# Patient Record
Sex: Male | Born: 1975
Health system: Southern US, Community
[De-identification: ages and names within clinical notes are randomized; demographics above are authoritative.]

## PROBLEM LIST (undated history)

## (undated) DIAGNOSIS — I10 Essential (primary) hypertension: Secondary | ICD-10-CM

## (undated) DIAGNOSIS — E785 Hyperlipidemia, unspecified: Secondary | ICD-10-CM

## (undated) HISTORY — PX: VASECTOMY: SHX75

## (undated) HISTORY — PX: INGUINAL HERNIA REPAIR: SUR1180

## (undated) HISTORY — PX: INGUINAL HERNIA REPAIR: SHX194

---

## 2010-11-29 ENCOUNTER — Emergency Department (HOSPITAL_COMMUNITY): Payer: PRIVATE HEALTH INSURANCE

## 2010-11-29 ENCOUNTER — Encounter: Payer: Self-pay | Admitting: Emergency Medicine

## 2010-11-29 ENCOUNTER — Other Ambulatory Visit: Payer: Self-pay

## 2010-11-29 ENCOUNTER — Emergency Department (HOSPITAL_COMMUNITY)
Admission: EM | Admit: 2010-11-29 | Discharge: 2010-11-29 | Disposition: A | Discharge status: Home / Self Care | Payer: PRIVATE HEALTH INSURANCE | Attending: Emergency Medicine | Admitting: Emergency Medicine

## 2010-11-29 DIAGNOSIS — E785 Hyperlipidemia, unspecified: Secondary | ICD-10-CM | POA: Insufficient documentation

## 2010-11-29 DIAGNOSIS — I10 Essential (primary) hypertension: Secondary | ICD-10-CM | POA: Insufficient documentation

## 2010-11-29 DIAGNOSIS — S0990XA Unspecified injury of head, initial encounter: Secondary | ICD-10-CM | POA: Insufficient documentation

## 2010-11-29 DIAGNOSIS — R296 Repeated falls: Secondary | ICD-10-CM | POA: Insufficient documentation

## 2010-11-29 DIAGNOSIS — R232 Flushing: Secondary | ICD-10-CM | POA: Insufficient documentation

## 2010-11-29 DIAGNOSIS — S0003XA Contusion of scalp, initial encounter: Secondary | ICD-10-CM | POA: Insufficient documentation

## 2010-11-29 DIAGNOSIS — R42 Dizziness and giddiness: Secondary | ICD-10-CM | POA: Insufficient documentation

## 2010-11-29 DIAGNOSIS — R55 Syncope and collapse: Secondary | ICD-10-CM | POA: Insufficient documentation

## 2010-11-29 DIAGNOSIS — R404 Transient alteration of awareness: Secondary | ICD-10-CM | POA: Insufficient documentation

## 2010-11-29 DIAGNOSIS — S1093XA Contusion of unspecified part of neck, initial encounter: Secondary | ICD-10-CM | POA: Insufficient documentation

## 2010-11-29 DIAGNOSIS — R51 Headache: Secondary | ICD-10-CM | POA: Insufficient documentation

## 2010-11-29 HISTORY — DX: Hyperlipidemia, unspecified: E78.5

## 2010-11-29 HISTORY — DX: Essential (primary) hypertension: I10

## 2010-11-29 LAB — DIFFERENTIAL
Eosinophils Absolute: 0.1 10*3/uL (ref 0.0–0.7)
Eosinophils Relative: 1 % (ref 0–5)
Lymphocytes Relative: 23 % (ref 12–46)
Lymphs Abs: 1.6 10*3/uL (ref 0.7–4.0)
Monocytes Absolute: 0.6 10*3/uL (ref 0.1–1.0)

## 2010-11-29 LAB — URINALYSIS, ROUTINE W REFLEX MICROSCOPIC
Bilirubin Urine: NEGATIVE
Leukocytes, UA: NEGATIVE
Nitrite: NEGATIVE
Specific Gravity, Urine: 1.023 (ref 1.005–1.030)
Urobilinogen, UA: 0.2 mg/dL (ref 0.0–1.0)
pH: 5 (ref 5.0–8.0)

## 2010-11-29 LAB — CBC
HCT: 43.8 % (ref 39.0–52.0)
MCH: 28.5 pg (ref 26.0–34.0)
MCV: 85.4 fL (ref 78.0–100.0)
Platelets: 166 10*3/uL (ref 150–400)
RBC: 5.13 MIL/uL (ref 4.22–5.81)
WBC: 7.1 10*3/uL (ref 4.0–10.5)

## 2010-11-29 LAB — COMPREHENSIVE METABOLIC PANEL
BUN: 17 mg/dL (ref 6–23)
CO2: 24 mEq/L (ref 19–32)
Calcium: 9.2 mg/dL (ref 8.4–10.5)
Creatinine, Ser: 0.97 mg/dL (ref 0.50–1.35)
GFR calc Af Amer: >90 mL/min (ref >90)
GFR calc non Af Amer: >90 mL/min (ref >90)
Glucose, Bld: 107 mg/dL — ABNORMAL HIGH (ref 70–99)
Sodium: 142 mEq/L (ref 135–145)
Total Protein: 6.6 g/dL (ref 6.0–8.3)

## 2010-11-29 MED ORDER — ACETAMINOPHEN 325 MG PO TABS
650.0000 mg | ORAL_TABLET | Freq: Four times a day (QID) | ORAL | Status: DC | PRN
  Administered 2010-11-29: 650 mg via ORAL

## 2010-11-29 NOTE — ED Notes (Signed)
Family at bedside. No distress noted. Pt provided with another ice pack.

## 2010-11-29 NOTE — ED Notes (Signed)
Pt fell from standing. Pt has small lac on face and hematoma on posterior scalp. Pt moving all extremities and handling own secretions. No distress noted. No neuro deficits noted.

## 2010-11-29 NOTE — ED Provider Notes (Signed)
History     CSN: [id number] Arrival date & time: 11/29/2010  3:39 AM   First MD Initiated Contact with Patient 11/29/10 0340      Chief Complaint  Patient presents with  . Loss of Consciousness    approx 1 minute. pt fell from standing    (Consider location/radiation/quality/duration/timing/severity/associated sxs/prior treatment) HPI Comments: While urinating in bathroom - had LOC and struck head on the floor - felt flushed, nauseated and hot prior to event, but no visual change, numbness, weakness, incontinence, blood in stool, v/c/n/v or other c/o tonight - currently back to self but c/o ongoing headache and hematoma to the back L and front R.  Patient is a 35 y.o. male presenting with syncope.  Loss of Consciousness This is a new problem. The current episode started less than 1 hour ago. The problem occurs rarely. The problem has been resolved. Associated symptoms include headaches ( after hitting head with fall in bath[place]). Pertinent negatives include no chest pain, no abdominal pain and no shortness of breath. Exacerbated by: taking niacin - has been making him feel flushed and dizzy nightly. The symptoms are relieved by rest. He has tried nothing for the symptoms.    Past Medical History  Diagnosis Date  . Hyperlipemia   . Hypertension     History reviewed. No pertinent past surgical history.  History reviewed. No pertinent family history.  History  Substance Use Topics  . Smoking status: Not on file  . Smokeless tobacco: Not on file  . Alcohol Use: Yes      Review of Systems  Respiratory: Negative for shortness of breath.   Cardiovascular: Positive for syncope. Negative for chest pain.  Gastrointestinal: Negative for abdominal pain.  Neurological: Positive for headaches ( after hitting head with fall in [place]).  All other systems reviewed and are negative.    Allergies  Review of patient's allergies indicates no known allergies.  Home Medications  No  current outpatient prescriptions on file.  BP 132/84  Pulse 87  Temp(Src) 97.3 F (36.3 C) (Oral)  Resp 15  Ht 5\' 9"  (1.753 m)  Wt 190 lb (86.183 kg)  BMI 28.06 kg/m2  SpO2 98%  Physical Exam  Nursing note and vitals reviewed. Constitutional: He appears well-developed and well-nourished. No distress.  HENT:  Head: Normocephalic.  Mouth/Throat: Oropharynx is clear and moist. No oropharyngeal exudate.       No malocclusion, hematoma to the R forehead and L occiput.  No lac  Eyes: Conjunctivae and EOM are normal. Pupils are equal, round, and reactive to light. Right eye exhibits no discharge. Left eye exhibits no discharge. No scleral icterus.  Neck: Normal range of motion. Neck supple. No JVD present. No thyromegaly present.  Cardiovascular: Normal rate, regular rhythm, normal heart sounds and intact distal pulses.  Exam reveals no gallop and no friction rub.   No murmur heard. Pulmonary/Chest: Effort normal and breath sounds normal. No respiratory distress. He has no wheezes. He has no rales.  Abdominal: Soft. Bowel sounds are normal. He exhibits no distension and no mass. There is no tenderness.  Musculoskeletal: Normal range of motion. He exhibits no edema and no tenderness ( no ttp to the spine ).  Lymphadenopathy:    He has no cervical adenopathy.  Neurological: He is alert. Coordination normal.        Speechg, motor, sensory and visual normal.  CN 3-12 intact.  Skin: Skin is warm and dry. No rash noted. No erythema.  Psychiatric: He  has a normal mood and affect. His behavior is normal.    ED Course  Procedures (including critical care time)  Labs Reviewed  COMPREHENSIVE METABOLIC PANEL - Abnormal; Notable for the following:    Glucose, Bld 107 (*)    All other components within normal limits  CBC  DIFFERENTIAL  URINALYSIS, ROUTINE W REFLEX MICROSCOPIC   Ct Head Wo Contrast  11/29/2010  *RADIOLOGY REPORT*  Clinical Data: Syncope; status post fall.  Hit head, with  abrasion above the right eye and swelling at the back of the head.  CT HEAD WITHOUT CONTRAST  Technique:  Contiguous axial images were obtained from the base of the skull through the vertex without contrast.  Comparison: None.  Findings: There is no evidence of acute infarction, mass lesion, or intra- or extra-axial hemorrhage on CT.  The posterior fossa, including the cerebellum, brainstem and fourth ventricle, is within normal limits.  The third and lateral ventricles, and basal ganglia are unremarkable in appearance.  The cerebral hemispheres are symmetric in appearance, with normal gray- white differentiation.  No mass effect or midline shift is seen.  There is no evidence of fracture; visualized osseous structures are unremarkable in appearance.  The visualized portions of the orbits are within normal limits.  The paranasal sinuses and mastoid air cells are well-aerated.  A scalp hematoma is noted overlying the high left parietal calvarium.  IMPRESSION:  1.  No evidence of traumatic intracranial injury or fracture. 2.  Scalp hematoma overlying the high left parietal calvarium.  Original Report Authenticated By: Tonia Ghent, M.D.     1. Syncope   2. Head injury       MDM  Likely related to niacin, ECG normal, labs and CT ordered.  ED ECG REPORT   Date: 11/29/2010   Rate: 76  Rhythm: normal sinus rhythm  QRS Axis: normal  Intervals: normal  ST/T Wave abnormalities: normal  Conduction Disutrbances:none  Narrative Interpretation:   Old EKG Reviewed: none available   Labs all neg, d/w pt - stop niacin - understanding expressed.      Vida Roller, MD 11/29/10 [phone]

## 2010-12-01 ENCOUNTER — Other Ambulatory Visit: Payer: Self-pay | Admitting: Family Medicine

## 2010-12-02 ENCOUNTER — Ambulatory Visit
Admission: RE | Admit: 2010-12-02 | Discharge: 2010-12-02 | Disposition: A | Discharge status: Home / Self Care | Payer: PRIVATE HEALTH INSURANCE | Source: Ambulatory Visit | Attending: Family Medicine | Admitting: Family Medicine

## 2014-01-28 ENCOUNTER — Telehealth: Payer: Self-pay | Admitting: Cardiology

## 2014-01-28 NOTE — Telephone Encounter (Signed)
Received records from [facility] [place] for appointment with Dr Antoine PocheHochrein on 01/31/14.  [facility] for Dr Hochrein's schedule on 01/31/14.  lp

## 2014-01-31 ENCOUNTER — Ambulatory Visit (INDEPENDENT_AMBULATORY_CARE_PROVIDER_SITE_OTHER): Payer: BC Managed Care – PPO | Admitting: Cardiology

## 2014-01-31 ENCOUNTER — Encounter: Payer: Self-pay | Admitting: Cardiology

## 2014-01-31 VITALS — BP 120/80 | Ht 68.0 in | Wt 220.0 lb

## 2014-01-31 DIAGNOSIS — R0789 Other chest pain: Secondary | ICD-10-CM | POA: Diagnosis not present

## 2014-01-31 DIAGNOSIS — R5383 Other fatigue: Secondary | ICD-10-CM

## 2014-01-31 NOTE — Progress Notes (Signed)
HPI The patient presents for evaluation of dyspnea. He has had a vague history of mitral valve prolapse although I reviewed an echo that was done in [place] last year and there was no mention of prolapse or regurgitation. He's had evaluation for dyspnea and I do see a negative exercise treadmill test in 2009. He actually had been doing well for a number of years but stopped exercising and started gaining weight more recently. He's had increased dyspnea. He notices this with activity such as climbing one or 2 flights of stairs. He does not have chest pressure, neck or arm discomfort. He does not have PND or orthopnea. He does have sleep apnea and feels better since he's been using CPAP. He has some mild lower extremity swelling.  He came in because of his family history and concerns about his risk factors and overall general decline in health.  Allergies  Allergen Reactions  . Niacin And Related Other (See Comments)    Passing out    Current Outpatient Prescriptions  Medication Sig Dispense Refill  . atorvastatin (LIPITOR) 10 MG tablet Take 10 mg by mouth daily.      Marland Kitchen. losartan (COZAAR) 50 MG tablet Take 50 mg by mouth daily.     No current facility-administered medications for this visit.    Past Medical History  Diagnosis Date  . Hyperlipemia   . Hypertension     No past surgical history on file.  No family history on file.  History   Social History  . Marital Status: Married    Spouse Name: N/A    Number of Children: N/A  . Years of Education: N/A   Occupational History  . Not on file.   Social History Main Topics  . Smoking status: Never Smoker   . Smokeless tobacco: Not on file  . Alcohol Use: 0.6 oz/week    1 Not specified per week  . Drug Use: Not on file  . Sexual Activity: Not on file   Other Topics Concern  . Not on file   Social History Narrative    ROS:    Positive for occasional leg cramps, headaches, dizziness, fatigue, decreased libido,  occasional constipation, reflux. Otherwise as stated in the history of present illness and negative for all other systems.  PHYSICAL EXAM BP 120/80 mmHg  Ht 5\' 8"  (1.727 m)  Wt 220 lb (99.791 kg)  BMI 33.46 kg/m2  GENERAL:  Well appearing HEENT:  Pupils equal round and reactive, fundi not visualized, oral mucosa unremarkable NECK:  No jugular venous distention, waveform within normal limits, carotid upstroke brisk and symmetric, no bruits, no thyromegaly LYMPHATICS:  No cervical, inguinal adenopathy LUNGS:  Clear to auscultation bilaterally BACK:  No CVA tenderness CHEST:  Unremarkable HEART:  PMI not displaced or sustained,S1 and S2 within normal limits, no S3, no S4, no clicks, no rubs, NO murmurs ABD:  Flat, positive bowel sounds normal in frequency in pitch, no bruits, no rebound, no guarding, no midline pulsatile mass, no hepatomegaly, no splenomegaly EXT:  2 plus pulses throughout, no edema, no cyanosis no clubbing SKIN:  No rashes no nodules NEURO:  Cranial nerves II through XII grossly intact, motor grossly intact throughout PSYCH:  Cognitively intact, oriented to person place and time  EKG:  Sinus rhythm, rate 90, axis within normal limits, intervals within normal limits, no acute ST-T wave changes.  01/31/2014  ASSESSMENT AND PLAN   OBESITY:  The patient understands the need to lose weight with diet and  exercise. We have discussed specific strategies for this.  HTN:  The blood pressure is at target. No change in medications is indicated. We will continue with therapeutic lifestyle changes (TLC).  DYSPNEA:  I suspect this is multifactorial. I don't strongly suspect obstructive coronary disease but he does have risk factors. He will need exercise testing. I will bring the patient back for a POET (Plain Old Exercise Test). This will allow me to screen for obstructive coronary disease, risk stratify and very importantly provide a prescription for exercise.  I would also like to  order coronary calcium score for risk stratification.  DYSLIPIDEMIA:  The patient has this followed by his primary provider. Goals will be set based on the results of the above testing. For now he will continue the meds as listed.  FATIGUE:  The patient has fatigue and other symptoms. I will check a testosterone level.

## 2014-01-31 NOTE — Patient Instructions (Addendum)
Your physician recommends that you schedule a follow-up appointment in: as needed with Dr. Antoine PocheHochrein  We are ordering some labs for you to get done  We are ordering a stress test for you to get done  We have ordered for you to get a CT calcium scoring test done at [address]

## 2014-02-03 ENCOUNTER — Encounter (HOSPITAL_COMMUNITY): Payer: Self-pay | Admitting: *Deleted

## 2014-02-11 ENCOUNTER — Ambulatory Visit (INDEPENDENT_AMBULATORY_CARE_PROVIDER_SITE_OTHER)
Admission: RE | Admit: 2014-02-11 | Discharge: 2014-02-11 | Disposition: A | Discharge status: Home / Self Care | Payer: Self-pay | Source: Ambulatory Visit | Attending: Cardiology | Admitting: Cardiology

## 2014-02-11 ENCOUNTER — Telehealth (HOSPITAL_COMMUNITY): Payer: Self-pay

## 2014-02-11 DIAGNOSIS — R5383 Other fatigue: Secondary | ICD-10-CM

## 2014-02-11 DIAGNOSIS — R0789 Other chest pain: Secondary | ICD-10-CM

## 2014-02-11 NOTE — Telephone Encounter (Signed)
Encounter complete. 

## 2014-02-13 ENCOUNTER — Ambulatory Visit (HOSPITAL_COMMUNITY)
Admission: RE | Admit: 2014-02-13 | Discharge: 2014-02-13 | Disposition: A | Discharge status: Home / Self Care | Payer: BC Managed Care – PPO | Source: Ambulatory Visit | Attending: Cardiovascular Disease | Admitting: Cardiovascular Disease

## 2014-02-13 DIAGNOSIS — R0609 Other forms of dyspnea: Secondary | ICD-10-CM | POA: Insufficient documentation

## 2014-02-13 DIAGNOSIS — R0789 Other chest pain: Secondary | ICD-10-CM

## 2014-02-13 NOTE — Procedures (Signed)
Exercise Treadmill Test  Pre-Exercise Testing Evaluation  NSR  Test  Exercise Tolerance Test Ordering MD: Angelina SheriffJake Hochrein, MD    Unique Test No: 1  Treadmill:  1  Indication for ETT: exertional dyspnea  Contraindication to ETT: No   Stress Modality: exercise - treadmill  Cardiac Imaging Performed: non   Protocol: standard Bruce - maximal  Max BP:  180/91  Max MPHR (bpm):  180 85% MPR (bpm):  153  MPHR obtained (bpm):  169 % MPHR obtained:  93  Reached 85% MPHR (min:sec):  6:20 Total Exercise Time (min-sec):  9  Workload in METS:  10.1 Borg Scale: 15  Reason ETT Terminated:  dyspnea    ST Segment Analysis At Rest: normal ST segments - no evidence of significant ST depression With Exercise: no evidence of significant ST depression  Other Information Arrhythmia:  No Angina during ETT:  absent (0) Quality of ETT:  diagnostic  ETT Interpretation:  normal - no evidence of ischemia by ST analysis  Comments: Good exercise tolerance. Normal BP response to exercise.   Thurmon FairMihai Nox Talent, MD, [facility] [phone] office [phone] pager

## 2014-02-14 LAB — TESTOSTERONE: TESTOSTERONE: 207 ng/dL — AB (ref 300–890)

## 2014-02-20 ENCOUNTER — Telehealth: Payer: Self-pay | Admitting: Cardiology

## 2014-02-20 NOTE — Telephone Encounter (Signed)
Pt is returning JC's call in reference to some blood work and a stress test he had done. Please call back  Thanks

## 2014-02-20 NOTE — Telephone Encounter (Signed)
Received call back from patient.Lab and calcium score results given.Advised will need to schedule appointment with PCP Dr.William Tiburcio PeaHarris.Copy of lab and calcium score sent to Dr.Harris.

## 2014-02-20 NOTE — Telephone Encounter (Signed)
Returned call to patient no answer.LMTC. 

## 2015-02-24 ENCOUNTER — Ambulatory Visit
Admission: RE | Admit: 2015-02-24 | Discharge: 2015-02-24 | Disposition: A | Discharge status: Home / Self Care | Payer: BC Managed Care – PPO | Source: Ambulatory Visit | Attending: Family Medicine | Admitting: Family Medicine

## 2015-02-24 ENCOUNTER — Other Ambulatory Visit: Payer: Self-pay | Admitting: Family Medicine

## 2015-02-24 DIAGNOSIS — R071 Chest pain on breathing: Secondary | ICD-10-CM

## 2015-02-24 MED ORDER — IOPAMIDOL (ISOVUE-300) INJECTION 61%
75.0000 mL | Freq: Once | INTRAVENOUS | Status: AC | PRN
  Administered 2015-02-24: 75 mL via INTRAVENOUS

## 2015-02-25 ENCOUNTER — Other Ambulatory Visit: Payer: BC Managed Care – PPO

## 2015-02-27 ENCOUNTER — Other Ambulatory Visit: Payer: Self-pay | Admitting: Family Medicine

## 2015-02-27 DIAGNOSIS — M7989 Other specified soft tissue disorders: Secondary | ICD-10-CM

## 2015-03-06 ENCOUNTER — Ambulatory Visit
Admission: RE | Admit: 2015-03-06 | Discharge: 2015-03-06 | Disposition: A | Discharge status: Home / Self Care | Payer: BC Managed Care – PPO | Source: Ambulatory Visit | Attending: Family Medicine | Admitting: Family Medicine

## 2015-03-06 DIAGNOSIS — M7989 Other specified soft tissue disorders: Secondary | ICD-10-CM

## 2015-03-06 MED ORDER — IOPAMIDOL (ISOVUE-300) INJECTION 61%
125.0000 mL | Freq: Once | INTRAVENOUS | Status: AC | PRN
  Administered 2015-03-06: 125 mL via INTRAVENOUS

## 2017-05-17 IMAGING — CT CT CHEST W/ CM
1 series · 15 of 31 positions shown, 19 images · IV contrast (iopamidol)
Comparison: Chest CT dated 02/11/2014

CLINICAL DATA: 39-year-old male with right chest cramping sensation
and chest wall pain.

EXAM:
CT CHEST WITH CONTRAST
TECHNIQUE: Multidetector CT imaging of the chest was performed during
intravenous contrast administration.
CONTRAST:  75mL K5R4EC-9SS IOPAMIDOL (K5R4EC-9SS) INJECTION 61%

[Series 2: chest w/cm · axial · 0.86mm/px · z∈[-297,-22]mm · 15 of 61 slices shown, 19 images]
[im 3/61  mediastinal]
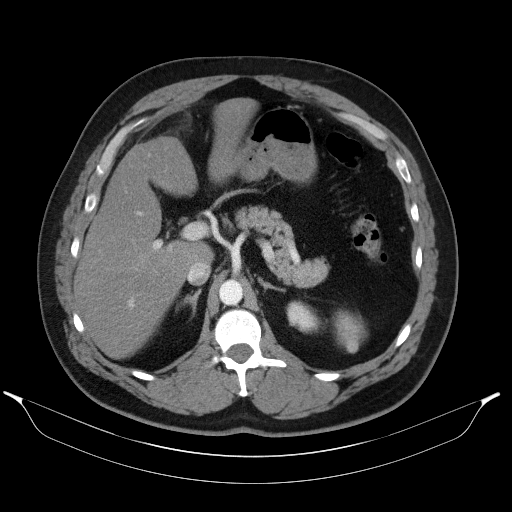
[im 3/61  lung]
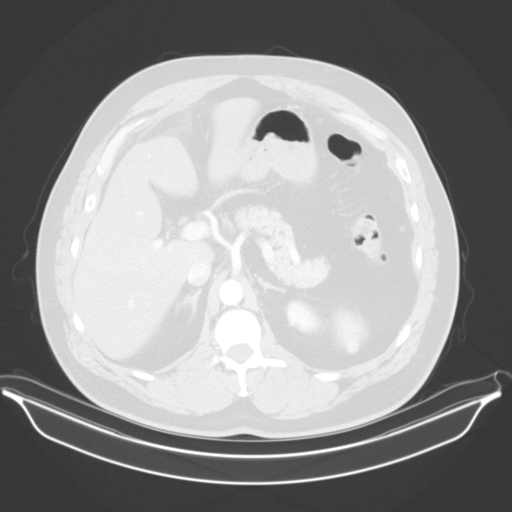
[im 7/61  lung]
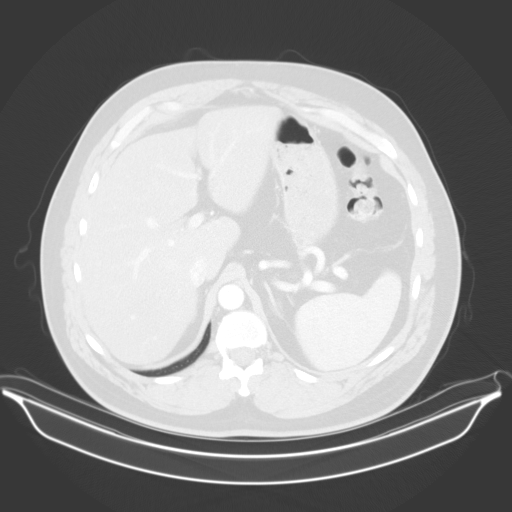
[im 12/61  lung]
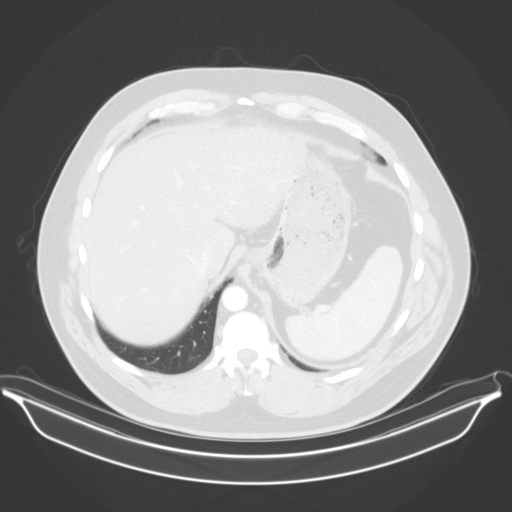
[im 14/61  lung]
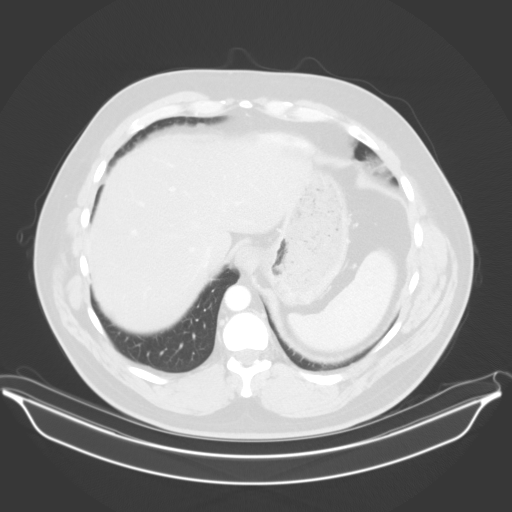
[im 18/61  mediastinal]
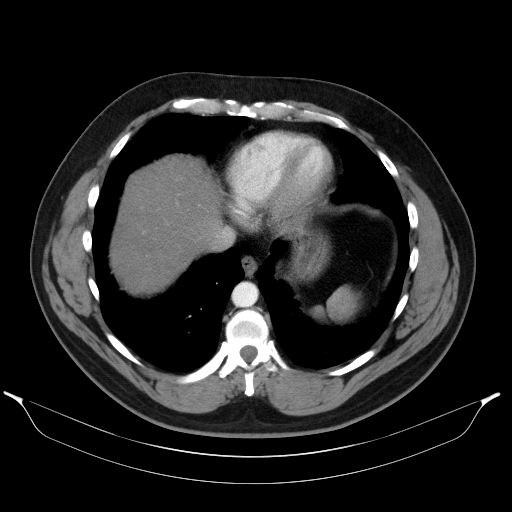
[im 18/61  lung]
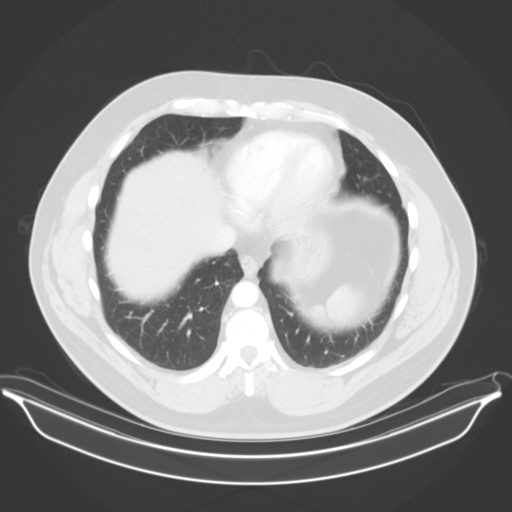
[im 23/61  lung]
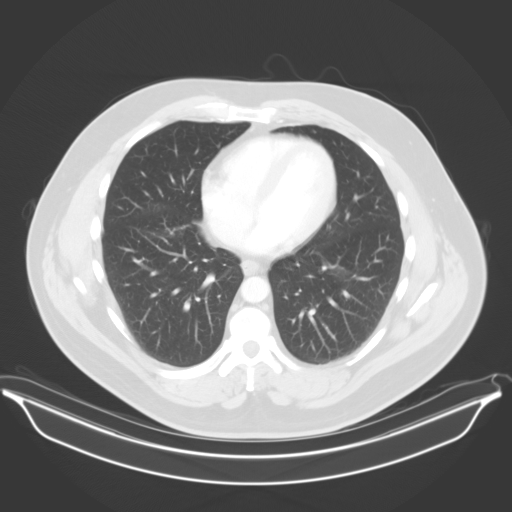
[im 27/61  lung]
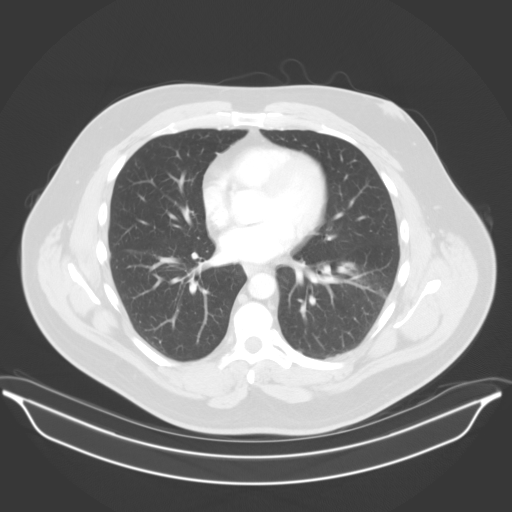
[im 32/61  lung]
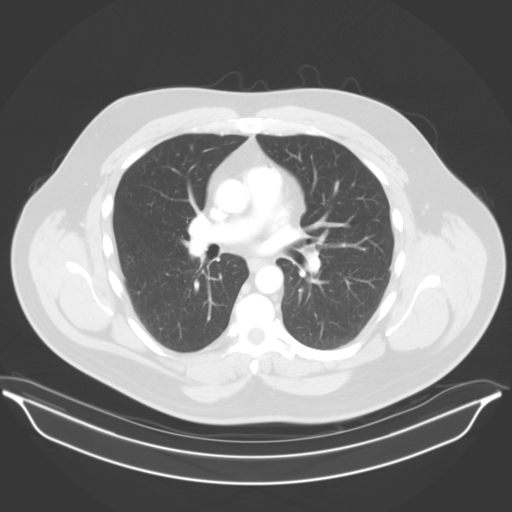
[im 34/61  mediastinal]
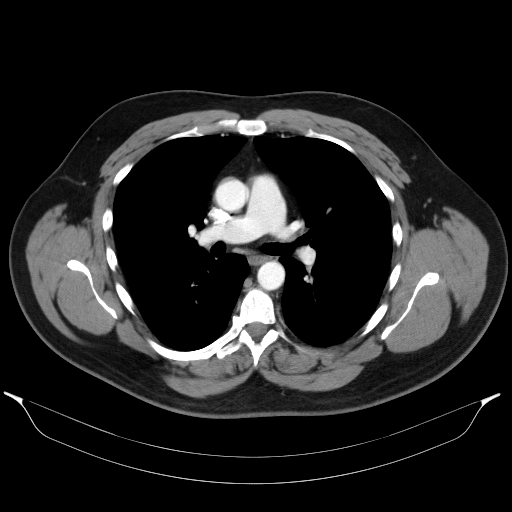
[im 34/61  lung]
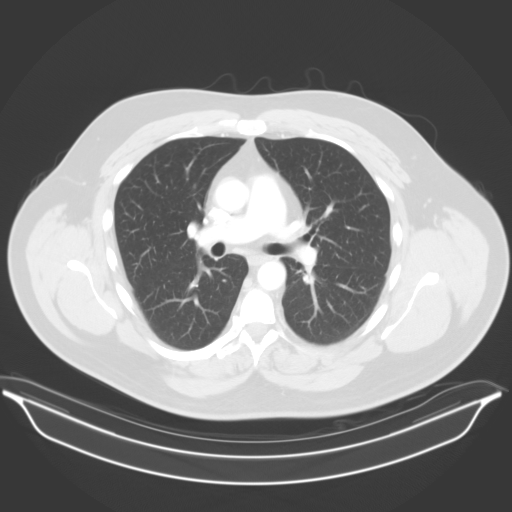
[im 37/61  lung]
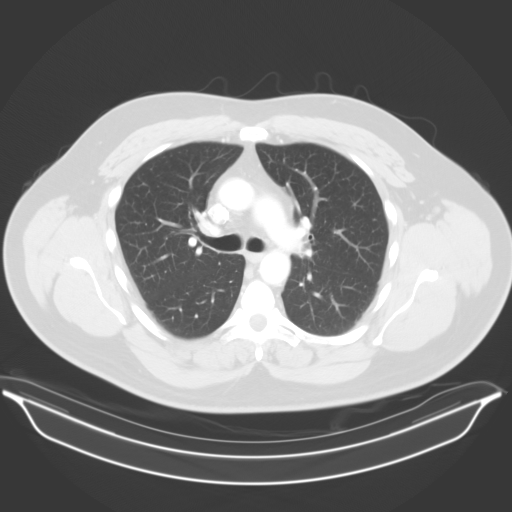
[im 41/61  lung]
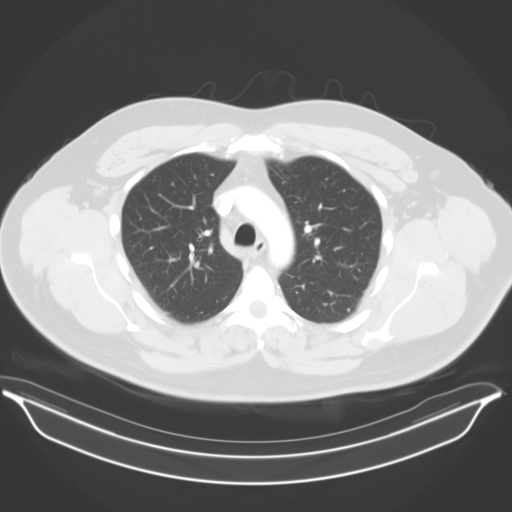
[im 45/61  lung]
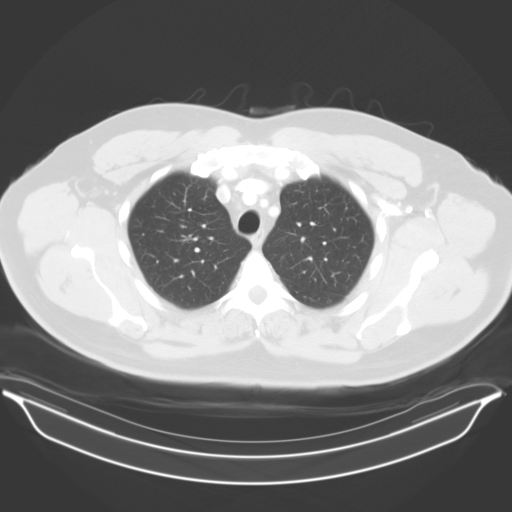
[im 49/61  mediastinal]
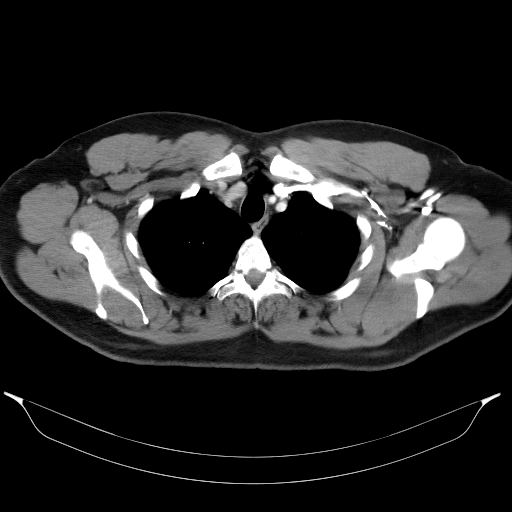
[im 49/61  lung]
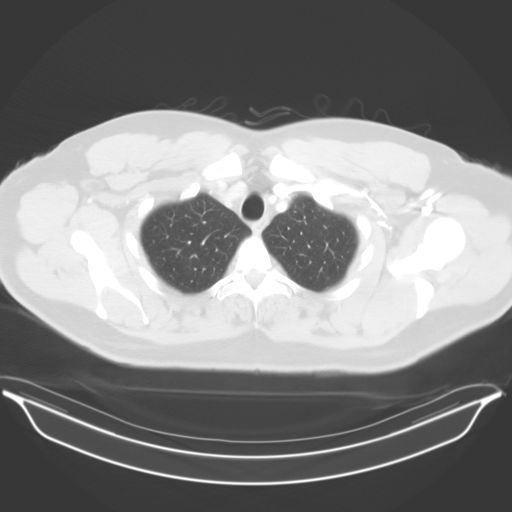
[im 54/61  lung]
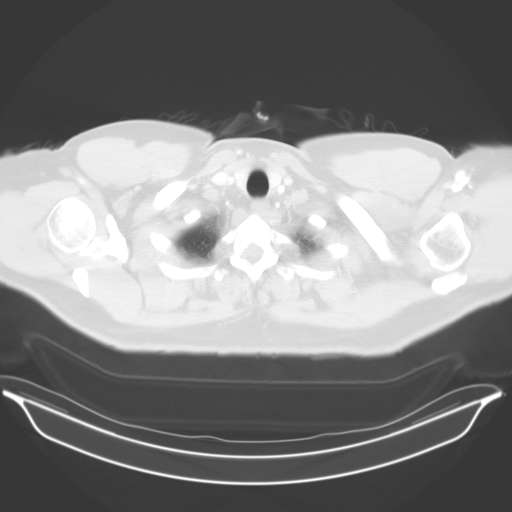
[im 58/61  lung]
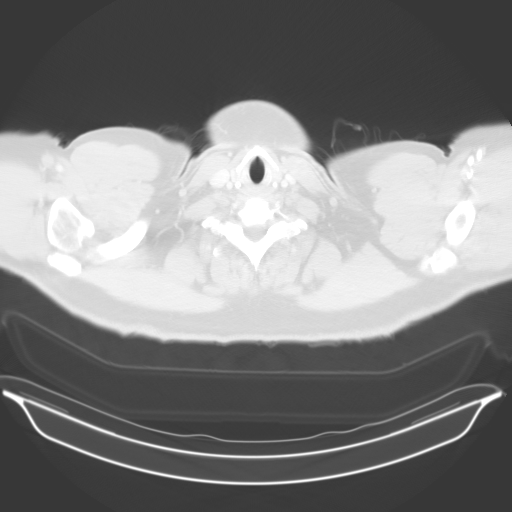

[15 of 31 positions shown; findings below may reference images not displayed]

FINDINGS: There is small linear atelectasis/scarring in the left lower lobe.
The lungs are otherwise clear. No pleural effusion or pneumothorax.
The central airways are patent.

The thoracic aorta and central pulmonary arteries appear
unremarkable. There is no cardiomegaly or pericardial effusion. No
hilar or mediastinal adenopathy. The esophagus and thyroid gland
appear grossly unremarkable.

There is no axillary adenopathy. The chest wall soft tissues appear
unremarkable with the osseous structures are intact.

A focal area fat stranding is partially seen anterior to the liver
in the right upper abdomen and inferior to the fissure for falciform
ligament likely representing a focal area of fat necrosis or
infarct. No fluid collection or abscess identified. The visualized
upper abdomen is otherwise unremarkable.
IMPRESSION: No acute intrathoracic pathology.

Partially visualized focal fat necrosis/infarct in the right upper
abdomen anterior to the liver.

## 2018-01-25 DIAGNOSIS — G4733 Obstructive sleep apnea (adult) (pediatric): Secondary | ICD-10-CM | POA: Diagnosis not present

## 2018-01-25 DIAGNOSIS — I10 Essential (primary) hypertension: Secondary | ICD-10-CM | POA: Diagnosis not present

## 2018-01-25 DIAGNOSIS — E291 Testicular hypofunction: Secondary | ICD-10-CM | POA: Diagnosis not present

## 2018-01-25 DIAGNOSIS — E7800 Pure hypercholesterolemia, unspecified: Secondary | ICD-10-CM | POA: Diagnosis not present

## 2018-02-12 DIAGNOSIS — J209 Acute bronchitis, unspecified: Secondary | ICD-10-CM | POA: Diagnosis not present

## 2018-02-19 DIAGNOSIS — I10 Essential (primary) hypertension: Secondary | ICD-10-CM | POA: Diagnosis not present

## 2018-02-19 DIAGNOSIS — E7800 Pure hypercholesterolemia, unspecified: Secondary | ICD-10-CM | POA: Diagnosis not present

## 2018-02-19 DIAGNOSIS — Z0000 Encounter for general adult medical examination without abnormal findings: Secondary | ICD-10-CM | POA: Diagnosis not present

## 2018-02-19 DIAGNOSIS — Z125 Encounter for screening for malignant neoplasm of prostate: Secondary | ICD-10-CM | POA: Diagnosis not present

## 2018-02-19 DIAGNOSIS — E291 Testicular hypofunction: Secondary | ICD-10-CM | POA: Diagnosis not present

## 2018-02-26 DIAGNOSIS — F4312 Post-traumatic stress disorder, chronic: Secondary | ICD-10-CM | POA: Diagnosis not present

## 2018-03-19 DIAGNOSIS — F4312 Post-traumatic stress disorder, chronic: Secondary | ICD-10-CM | POA: Diagnosis not present

## 2018-04-09 DIAGNOSIS — F4312 Post-traumatic stress disorder, chronic: Secondary | ICD-10-CM | POA: Diagnosis not present

## 2018-05-07 DIAGNOSIS — F411 Generalized anxiety disorder: Secondary | ICD-10-CM | POA: Diagnosis not present

## 2018-05-15 DIAGNOSIS — G4733 Obstructive sleep apnea (adult) (pediatric): Secondary | ICD-10-CM | POA: Diagnosis not present

## 2018-06-04 DIAGNOSIS — F411 Generalized anxiety disorder: Secondary | ICD-10-CM | POA: Diagnosis not present

## 2018-07-16 DIAGNOSIS — F411 Generalized anxiety disorder: Secondary | ICD-10-CM | POA: Diagnosis not present

## 2018-08-08 DIAGNOSIS — F411 Generalized anxiety disorder: Secondary | ICD-10-CM | POA: Diagnosis not present

## 2018-08-28 DIAGNOSIS — F419 Anxiety disorder, unspecified: Secondary | ICD-10-CM | POA: Diagnosis not present

## 2018-08-28 DIAGNOSIS — E7800 Pure hypercholesterolemia, unspecified: Secondary | ICD-10-CM | POA: Diagnosis not present

## 2018-08-28 DIAGNOSIS — I10 Essential (primary) hypertension: Secondary | ICD-10-CM | POA: Diagnosis not present

## 2018-08-28 DIAGNOSIS — E291 Testicular hypofunction: Secondary | ICD-10-CM | POA: Diagnosis not present

## 2018-09-03 DIAGNOSIS — F411 Generalized anxiety disorder: Secondary | ICD-10-CM | POA: Diagnosis not present

## 2018-09-17 DIAGNOSIS — F411 Generalized anxiety disorder: Secondary | ICD-10-CM | POA: Diagnosis not present

## 2018-09-18 DIAGNOSIS — R05 Cough: Secondary | ICD-10-CM | POA: Diagnosis not present

## 2018-09-18 DIAGNOSIS — R6883 Chills (without fever): Secondary | ICD-10-CM | POA: Diagnosis not present

## 2018-09-19 DIAGNOSIS — R05 Cough: Secondary | ICD-10-CM | POA: Diagnosis not present

## 2018-09-21 ENCOUNTER — Ambulatory Visit (INDEPENDENT_AMBULATORY_CARE_PROVIDER_SITE_OTHER): Payer: BC Managed Care – PPO

## 2018-09-21 ENCOUNTER — Ambulatory Visit (HOSPITAL_COMMUNITY)
Admission: EM | Admit: 2018-09-21 | Discharge: 2018-09-21 | Disposition: A | Discharge status: Home / Self Care | Payer: BC Managed Care – PPO | Attending: Family Medicine | Admitting: Family Medicine

## 2018-09-21 ENCOUNTER — Encounter (HOSPITAL_COMMUNITY): Payer: Self-pay

## 2018-09-21 ENCOUNTER — Other Ambulatory Visit: Payer: Self-pay

## 2018-09-21 DIAGNOSIS — J069 Acute upper respiratory infection, unspecified: Secondary | ICD-10-CM | POA: Diagnosis not present

## 2018-09-21 DIAGNOSIS — Z20828 Contact with and (suspected) exposure to other viral communicable diseases: Secondary | ICD-10-CM | POA: Diagnosis not present

## 2018-09-21 DIAGNOSIS — B9789 Other viral agents as the cause of diseases classified elsewhere: Secondary | ICD-10-CM | POA: Diagnosis not present

## 2018-09-21 DIAGNOSIS — R05 Cough: Secondary | ICD-10-CM

## 2018-09-21 DIAGNOSIS — R0602 Shortness of breath: Secondary | ICD-10-CM | POA: Diagnosis not present

## 2018-09-21 MED ORDER — HYDROCODONE-HOMATROPINE 5-1.5 MG/5ML PO SYRP: 5.0000 mL | ORAL_SOLUTION | Freq: Every evening | ORAL | 0 refills | Status: DC | PRN

## 2018-09-21 MED ORDER — BENZONATATE 200 MG PO CAPS: 200.0000 mg | ORAL_CAPSULE | Freq: Three times a day (TID) | ORAL | 0 refills | Status: AC | PRN

## 2018-09-21 MED ORDER — CETIRIZINE HCL 10 MG PO CAPS: 10.0000 mg | ORAL_CAPSULE | Freq: Every day | ORAL | 0 refills | Status: DC

## 2018-09-21 MED ORDER — DM-GUAIFENESIN ER 30-600 MG PO TB12: 1.0000 | ORAL_TABLET | Freq: Two times a day (BID) | ORAL | 0 refills | Status: DC

## 2018-09-21 MED ORDER — DOXYCYCLINE HYCLATE 100 MG PO CAPS: 100.0000 mg | ORAL_CAPSULE | Freq: Two times a day (BID) | ORAL | 0 refills | Status: DC

## 2018-09-21 NOTE — ED Triage Notes (Signed)
Patient presents to Urgent Care with complaints of fever, chills, nasal congestion, cough, sneezing, dizziness, and bad headaches since 6 days ago. Patient reports he had a tele-visit Tuesday and negative COVID wednesday but was told to come here and be retested for COVID and get a chest x-ray to check for pneumonia.

## 2018-09-21 NOTE — Discharge Instructions (Signed)
COVID swab pending. Should return in approximately 3 days  Daily cetirizine for congestion/drainage/sneezing Mucinex dm for congestion and cough twice daily Tessalon every 8 hours for cough during the day as needed Hycodan as needed for night time cough- do not drive or work after taking  May fill doxycycline on Tuesday if still without any improvement.

## 2018-09-22 NOTE — ED Provider Notes (Signed)
[facility]    CSN: [id number] Arrival date & time: 09/21/18  1841      History   Chief Complaint Chief Complaint  Patient presents with  . Fever    HPI Jordan Mclean is a 43 y.o. male history of hypertension hyperlipidemia presenting today for evaluation of cough and congestion.  Patient states that beginning last Sunday he developed congestion, cough, sneezing.  He had subjective fevers and chills.  He had a tele-visit and had COVID testing that returned negative on Wednesday.  He was advised to come here for repeat testing and chest x-ray as he has had persistent symptoms and has recently developed increased shortness of breath and dizziness.  He has been taking Tylenol for headache, but otherwise minimal over-the-counter medicines.  Denies history of asthma or tobacco use.  HPI  Past Medical History:  Diagnosis Date  . Hyperlipemia   . Hypertension     There are no active problems to display for this patient.   Past Surgical History:  Procedure Laterality Date  . INGUINAL HERNIA REPAIR         Home Medications    Prior to Admission medications   Medication Sig Start Date End Date Taking? Authorizing Provider  atorvastatin (LIPITOR) 10 MG tablet Take 10 mg by mouth daily.     Yes [provider]  losartan (COZAAR) 50 MG tablet Take 50 mg by mouth daily.   Yes [provider]  benzonatate (TESSALON) 200 MG capsule Take 1 capsule (200 mg total) by mouth 3 (three) times daily as needed for up to 7 days for cough (daytime cough). 09/21/18 09/28/18  ,  C, PA-C  Cetirizine HCl 10 MG CAPS Take 1 capsule (10 mg total) by mouth daily. 09/21/18   ,  C, PA-C  dextromethorphan-guaiFENesin (MUCINEX DM) 30-600 MG 12hr tablet Take 1 tablet by mouth 2 (two) times daily. 09/21/18   ,  C, PA-C  doxycycline (VIBRAMYCIN) 100 MG capsule Take 1 capsule (100 mg total) by mouth 2 (two) times daily. 09/25/18   ,  C, PA-C   HYDROcodone-homatropine (HYCODAN) 5-1.5 MG/5ML syrup Take 5 mLs by mouth at bedtime as needed for cough. 09/21/18   , Junius Creamer, PA-C    Family History Family History  Problem Relation Age of Onset  . CAD Father 81       MI  . Hypertension Father   . COPD Father   . Hypertension Brother   . Heart disease Son   . Diabetes Mother     Social History Social History   Tobacco Use  . Smoking status: Never Smoker  . Smokeless tobacco: Never Used  Substance Use Topics  . Alcohol use: Yes    Alcohol/week: 1.0 standard drinks    Types: 1 Standard drinks or equivalent per week  . Drug use: Not on file     Allergies   Niacin and related   Review of Systems Review of Systems  Constitutional: Positive for fatigue. Negative for activity change, appetite change, chills and fever.  HENT: Positive for congestion and rhinorrhea. Negative for ear pain, sinus pressure, sore throat and trouble swallowing.   Eyes: Negative for discharge and redness.  Respiratory: Positive for cough and shortness of breath. Negative for chest tightness.   Cardiovascular: Negative for chest pain.  Gastrointestinal: Negative for abdominal pain, diarrhea, nausea and vomiting.  Musculoskeletal: Negative for myalgias.  Skin: Negative for rash.  Neurological: Negative for dizziness, light-headedness and headaches.  Physical Exam Triage Vital Signs ED Triage Vitals  Enc Vitals Group     BP 09/21/18 2002 139/81     Pulse Rate 09/21/18 2002 67     Resp 09/21/18 2002 16     Temp 09/21/18 2002 98.3 F (36.8 C)     Temp Source 09/21/18 2002 Temporal     SpO2 09/21/18 2002 99 %     Weight --      Height --      Head Circumference --      Peak Flow --      Pain Score 09/21/18 2000 0     Pain Loc --      Pain Edu? --      Excl. in [place]? --    No data found.  Updated Vital Signs BP 139/81 (BP Location: Left Arm)   Pulse 67   Temp 98.3 F (36.8 C) (Temporal)   Resp 16   SpO2 99%   Visual  Acuity Right Eye Distance:   Left Eye Distance:   Bilateral Distance:    Right Eye Near:   Left Eye Near:    Bilateral Near:     Physical Exam Vitals signs and nursing note reviewed.  Constitutional:      Appearance: He is well-developed.  HENT:     Head: Normocephalic and atraumatic.     Ears:     Comments: Bilateral ears without tenderness to palpation of external auricle, tragus and mastoid, EAC's without erythema or swelling, TM's with good bony landmarks and cone of light. Non erythematous.     Nose:     Comments: Nasal mucosa erythematous    Mouth/Throat:     Comments: Oral mucosa pink and moist, no tonsillar enlargement or exudate. Posterior pharynx patent and nonerythematous, no uvula deviation or swelling. Normal phonation. Eyes:     Conjunctiva/sclera: Conjunctivae normal.  Neck:     Musculoskeletal: Neck supple.  Cardiovascular:     Rate and Rhythm: Normal rate and regular rhythm.     Heart sounds: No murmur.  Pulmonary:     Effort: Pulmonary effort is normal. No respiratory distress.     Breath sounds: Normal breath sounds.     Comments: Breathing comfortably at rest, CTABL, no wheezing, rales or other adventitious sounds auscultated No coughing during exam Abdominal:     Palpations: Abdomen is soft.     Tenderness: There is no abdominal tenderness.  Skin:    General: Skin is warm and dry.  Neurological:     Mental Status: He is alert.      UC Treatments / Results  Labs (all labs ordered are listed, but only abnormal results are displayed) Labs Reviewed  NOVEL CORONAVIRUS, NAA (HOSP ORDER, SEND-OUT TO REF LAB; TAT 18-24 HRS)    EKG   Radiology Dg Chest 2 View  Result Date: 09/21/2018 CLINICAL DATA:  Dry cough x 1 week, worsening cough, SOB. Hx of high blood pressure. No diabetes or asthma. Nonsmoker EXAM: CHEST - 2 VIEW COMPARISON:  02/24/2015 FINDINGS: The heart size and mediastinal contours are within normal limits. Both lungs are clear. The  visualized skeletal structures are unremarkable. IMPRESSION: No active cardiopulmonary disease. Electronically Signed   By: Nolon Nations M.D.   On: 09/21/2018 20:49    Procedures Procedures (including critical care time)  Medications Ordered in UC Medications - No data to display  Initial Impression / Assessment and Plan / UC Course  I have reviewed the triage vital signs and the  nursing notes.  Pertinent labs & imaging results that were available during my care of the patient were reviewed by me and considered in my medical decision making (see chart for details).     Patient with URI symptoms and cough approaching 1 week.  COVID swab pending.  Chest x-ray negative.  Recommending continued symptomatic and supportive care with Zyrtec, Mucinex, Tessalon for cough during the day, Hycodan for nighttime cough as cough and congestion keeping up at nighttime.  Did provide prescription for doxycycline to fill on Tuesday if not having any improvement with continued use of symptomatic and supportive care above over the next 3 to 4 days.Discussed strict return precautions. Patient verbalized understanding and is agreeable with plan.  Final Clinical Impressions(s) / UC Diagnoses   Final diagnoses:  Viral URI with cough     Discharge Instructions     COVID swab pending. Should return in approximately 3 days  Daily cetirizine for congestion/drainage/sneezing Mucinex dm for congestion and cough twice daily Tessalon every 8 hours for cough during the day as needed Hycodan as needed for night time cough- do not drive or work after taking  May fill doxycycline on Tuesday if still without any improvement.    ED Prescriptions    Medication Sig Dispense Auth. Provider   Cetirizine HCl 10 MG CAPS Take 1 capsule (10 mg total) by mouth daily. 30 capsule ,  C, PA-C   dextromethorphan-guaiFENesin (MUCINEX DM) 30-600 MG 12hr tablet Take 1 tablet by mouth 2 (two) times daily. 20 tablet  ,  C, PA-C   benzonatate (TESSALON) 200 MG capsule Take 1 capsule (200 mg total) by mouth 3 (three) times daily as needed for up to 7 days for cough (daytime cough). 28 capsule ,  C, PA-C   HYDROcodone-homatropine (HYCODAN) 5-1.5 MG/5ML syrup Take 5 mLs by mouth at bedtime as needed for cough. 60 mL ,  C, PA-C   doxycycline (VIBRAMYCIN) 100 MG capsule Take 1 capsule (100 mg total) by mouth 2 (two) times daily. 20 capsule ,  C, PA-C     Controlled Substance Prescriptions [place] Controlled Substance Registry consulted? Not Applicable   Lew Dawes,  C, [place]PA-C 09/22/18 1011

## 2018-09-23 LAB — NOVEL CORONAVIRUS, NAA (HOSP ORDER, SEND-OUT TO REF LAB; TAT 18-24 HRS): SARS-CoV-2, NAA: NOT DETECTED

## 2018-09-24 ENCOUNTER — Encounter (HOSPITAL_COMMUNITY): Payer: Self-pay

## 2018-10-03 DIAGNOSIS — F411 Generalized anxiety disorder: Secondary | ICD-10-CM | POA: Diagnosis not present

## 2018-10-25 DIAGNOSIS — F411 Generalized anxiety disorder: Secondary | ICD-10-CM | POA: Diagnosis not present

## 2018-11-19 DIAGNOSIS — F411 Generalized anxiety disorder: Secondary | ICD-10-CM | POA: Diagnosis not present

## 2018-12-24 DIAGNOSIS — F411 Generalized anxiety disorder: Secondary | ICD-10-CM | POA: Diagnosis not present

## 2019-01-13 DIAGNOSIS — Z20828 Contact with and (suspected) exposure to other viral communicable diseases: Secondary | ICD-10-CM | POA: Diagnosis not present

## 2019-01-13 DIAGNOSIS — G4489 Other headache syndrome: Secondary | ICD-10-CM | POA: Diagnosis not present

## 2019-01-17 DIAGNOSIS — R519 Headache, unspecified: Secondary | ICD-10-CM | POA: Diagnosis not present

## 2019-01-17 DIAGNOSIS — R5383 Other fatigue: Secondary | ICD-10-CM | POA: Diagnosis not present

## 2019-01-17 DIAGNOSIS — U071 COVID-19: Secondary | ICD-10-CM | POA: Diagnosis not present

## 2019-01-28 DIAGNOSIS — F411 Generalized anxiety disorder: Secondary | ICD-10-CM | POA: Diagnosis not present

## 2019-02-12 DIAGNOSIS — U071 COVID-19: Secondary | ICD-10-CM | POA: Diagnosis not present

## 2019-02-18 DIAGNOSIS — F411 Generalized anxiety disorder: Secondary | ICD-10-CM | POA: Diagnosis not present

## 2019-02-22 DIAGNOSIS — I10 Essential (primary) hypertension: Secondary | ICD-10-CM | POA: Diagnosis not present

## 2019-02-22 DIAGNOSIS — E291 Testicular hypofunction: Secondary | ICD-10-CM | POA: Diagnosis not present

## 2019-02-22 DIAGNOSIS — Z125 Encounter for screening for malignant neoplasm of prostate: Secondary | ICD-10-CM | POA: Diagnosis not present

## 2019-02-22 DIAGNOSIS — E7800 Pure hypercholesterolemia, unspecified: Secondary | ICD-10-CM | POA: Diagnosis not present

## 2019-02-22 DIAGNOSIS — Z0000 Encounter for general adult medical examination without abnormal findings: Secondary | ICD-10-CM | POA: Diagnosis not present

## 2019-02-22 DIAGNOSIS — M545 Low back pain: Secondary | ICD-10-CM | POA: Diagnosis not present

## 2019-03-15 DIAGNOSIS — H5213 Myopia, bilateral: Secondary | ICD-10-CM | POA: Diagnosis not present

## 2019-03-15 DIAGNOSIS — H524 Presbyopia: Secondary | ICD-10-CM | POA: Diagnosis not present

## 2019-03-15 DIAGNOSIS — D3131 Benign neoplasm of right choroid: Secondary | ICD-10-CM | POA: Diagnosis not present

## 2019-05-14 DIAGNOSIS — G4733 Obstructive sleep apnea (adult) (pediatric): Secondary | ICD-10-CM | POA: Diagnosis not present

## 2019-09-03 DIAGNOSIS — F419 Anxiety disorder, unspecified: Secondary | ICD-10-CM | POA: Diagnosis not present

## 2019-09-03 DIAGNOSIS — E291 Testicular hypofunction: Secondary | ICD-10-CM | POA: Diagnosis not present

## 2019-09-03 DIAGNOSIS — E7800 Pure hypercholesterolemia, unspecified: Secondary | ICD-10-CM | POA: Diagnosis not present

## 2019-09-03 DIAGNOSIS — I10 Essential (primary) hypertension: Secondary | ICD-10-CM | POA: Diagnosis not present

## 2020-02-20 DIAGNOSIS — E119 Type 2 diabetes mellitus without complications: Secondary | ICD-10-CM | POA: Insufficient documentation

## 2020-02-20 DIAGNOSIS — G4733 Obstructive sleep apnea (adult) (pediatric): Secondary | ICD-10-CM | POA: Insufficient documentation

## 2020-07-30 LAB — TSH: TSH: 0.9 (ref 0.41–5.90)

## 2020-09-07 LAB — LIPID PANEL
Cholesterol: 167 (ref 0–200)
HDL: 45 (ref 35–70)
LDL Cholesterol: 89
LDl/HDL Ratio: 3.7
Triglycerides: 192 — AB (ref 40–160)

## 2020-09-07 LAB — HEPATIC FUNCTION PANEL
ALT: 34 U/L (ref 10–40)
AST: 23 (ref 14–40)
Alkaline Phosphatase: 43 (ref 25–125)
Bilirubin, Total: 1.1

## 2020-09-07 LAB — BASIC METABOLIC PANEL
BUN: 21 (ref 4–21)
CO2: 28 — AB (ref 13–22)
Chloride: 108 (ref 99–108)
Creatinine: 1 (ref 0.6–1.3)
Glucose: 94
Potassium: 4.9 mEq/L (ref 3.5–5.1)
Sodium: 144 (ref 137–147)

## 2020-09-07 LAB — HEMOGLOBIN A1C: Hemoglobin A1C: 5.5

## 2020-09-07 LAB — CBC AND DIFFERENTIAL
HCT: 47 (ref 41–53)
Hemoglobin: 15.9 (ref 13.5–17.5)
Neutrophils Absolute: 47.1
Platelets: 277 10*3/uL (ref 150–400)
WBC: 3.4

## 2020-09-07 LAB — COMPREHENSIVE METABOLIC PANEL
Albumin: 4.8 (ref 3.5–5.0)
Calcium: 10.1 (ref 8.7–10.7)

## 2020-09-07 LAB — TESTOSTERONE: Testosterone: 206.5

## 2020-09-07 LAB — CBC: RBC: 5.32 — AB (ref 3.87–5.11)

## 2020-09-07 LAB — PSA: PSA: 0.38

## 2020-12-12 IMAGING — DX DG CHEST 2V
2 series · 2 of 2 positions shown · non-contrast
Comparison: 02/24/2015

CLINICAL DATA: Dry cough x 1 week, worsening cough, SOB. Hx of high
blood pressure. No diabetes or asthma. Nonsmoker

EXAM:
CHEST - 2 VIEW

[chest pa]
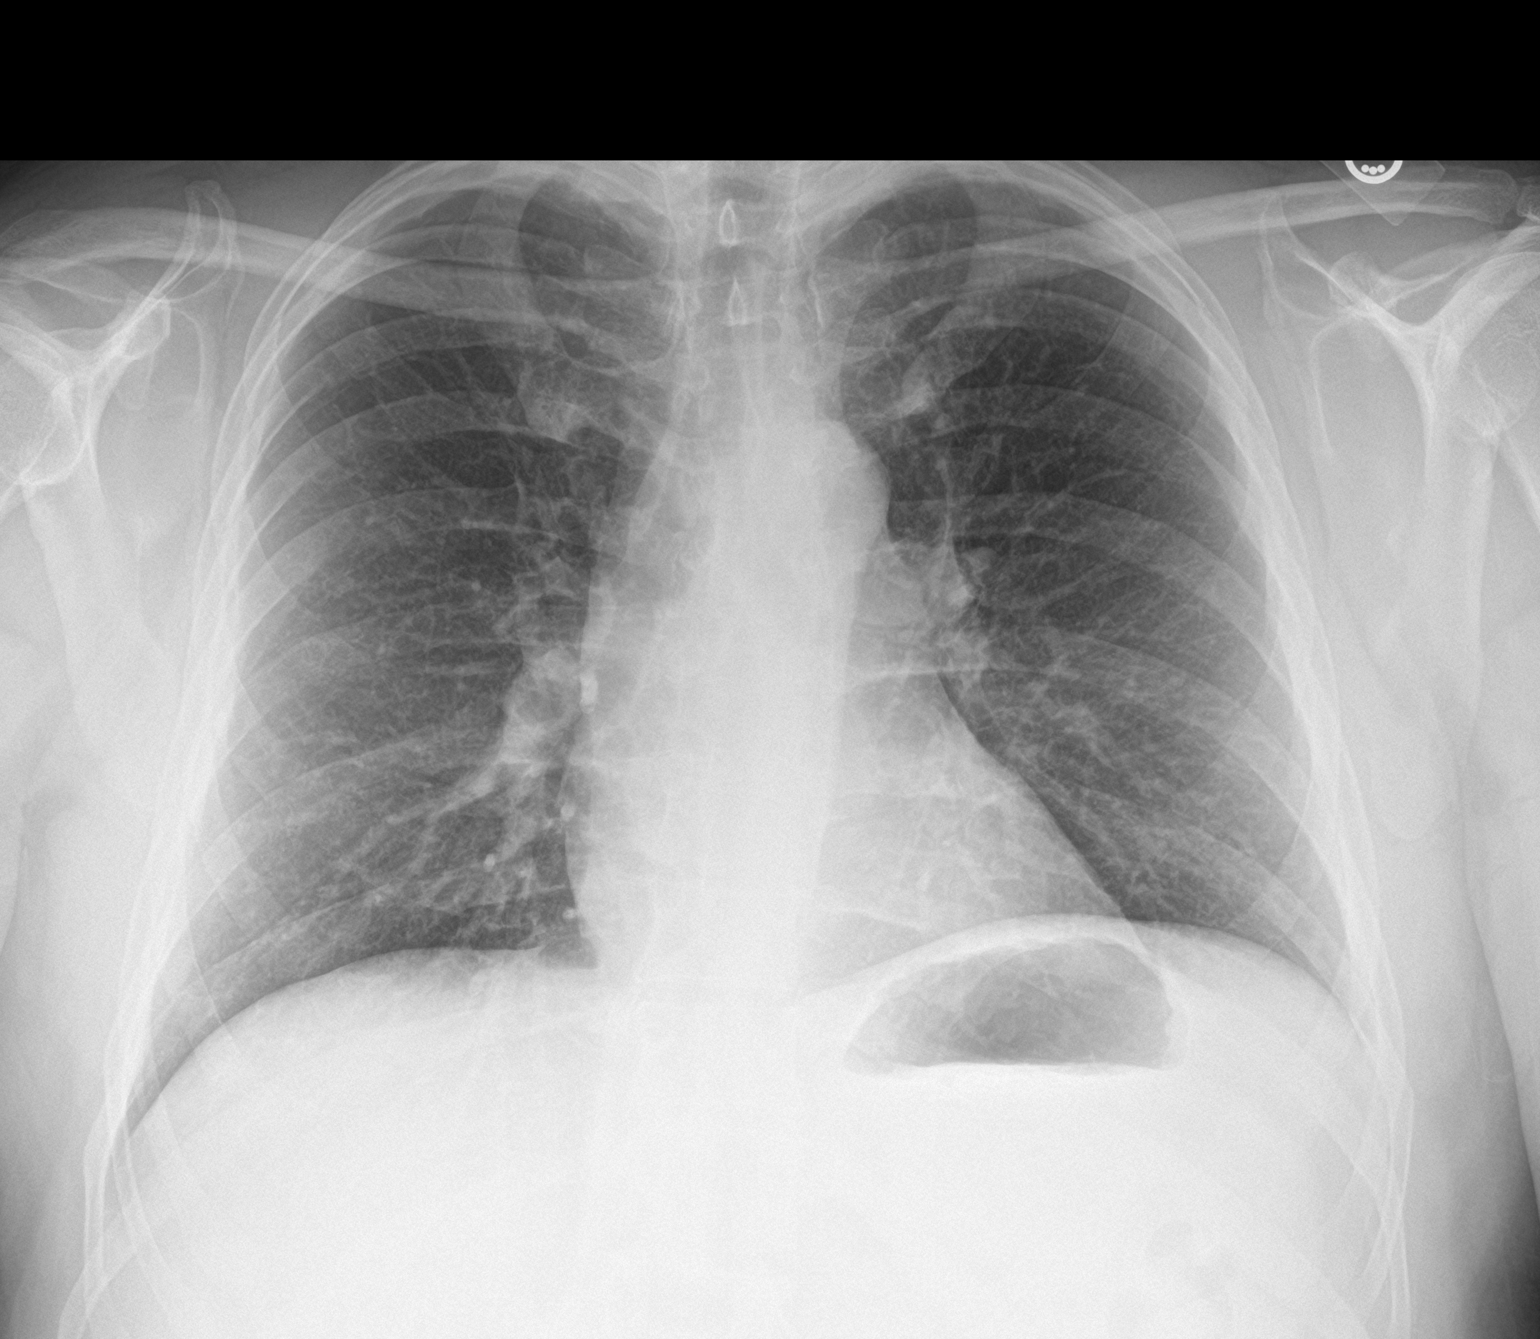

[chest lat]
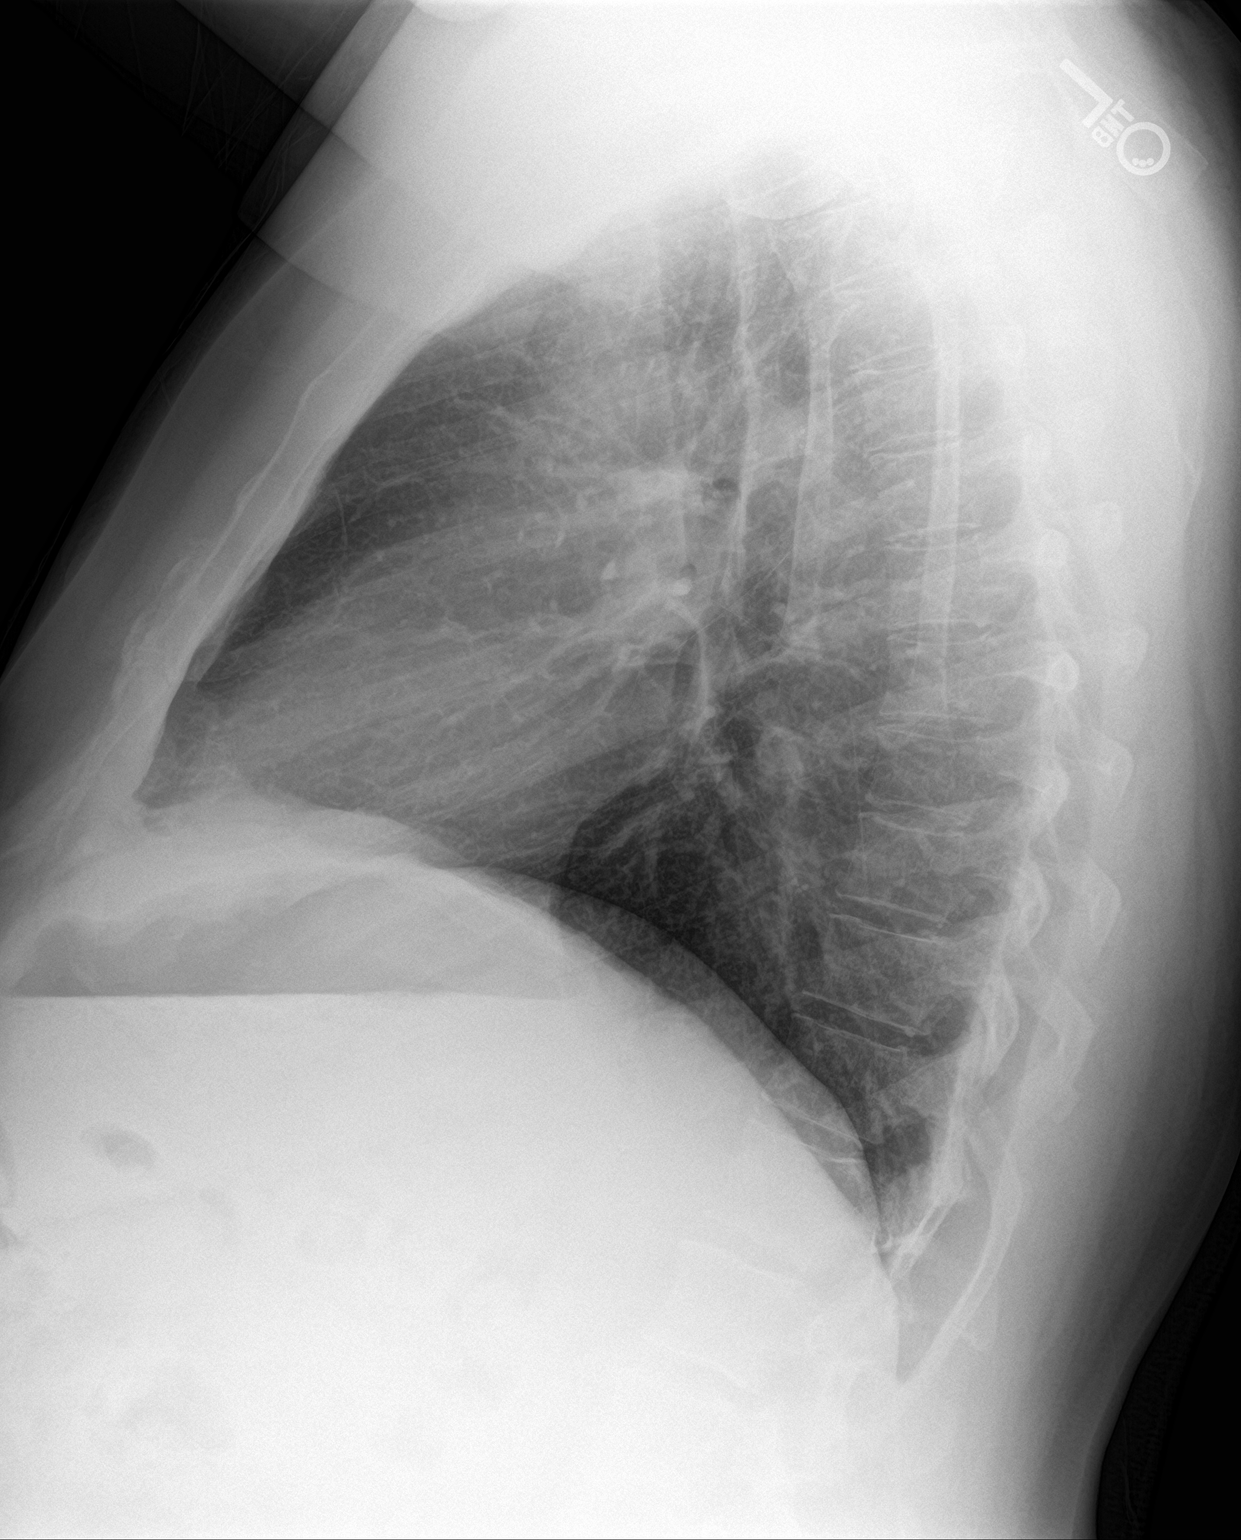

[2 of 2 positions shown; findings below may reference images not displayed]

FINDINGS: The heart size and mediastinal contours are within normal limits.
Both lungs are clear. The visualized skeletal structures are
unremarkable.
IMPRESSION: No active cardiopulmonary disease.

## 2021-01-17 HISTORY — PX: INGUINAL HERNIA REPAIR: SUR1180

## 2021-03-13 ENCOUNTER — Encounter (HOSPITAL_BASED_OUTPATIENT_CLINIC_OR_DEPARTMENT_OTHER): Payer: Self-pay | Admitting: *Deleted

## 2021-03-13 ENCOUNTER — Emergency Department (HOSPITAL_BASED_OUTPATIENT_CLINIC_OR_DEPARTMENT_OTHER): Payer: 59

## 2021-03-13 ENCOUNTER — Emergency Department (HOSPITAL_BASED_OUTPATIENT_CLINIC_OR_DEPARTMENT_OTHER)
Admission: EM | Admit: 2021-03-13 | Discharge: 2021-03-14 | Disposition: A | Discharge status: Home / Self Care | Payer: 59 | Attending: Emergency Medicine | Admitting: Emergency Medicine

## 2021-03-13 ENCOUNTER — Other Ambulatory Visit: Payer: Self-pay

## 2021-03-13 DIAGNOSIS — K5792 Diverticulitis of intestine, part unspecified, without perforation or abscess without bleeding: Secondary | ICD-10-CM | POA: Insufficient documentation

## 2021-03-13 DIAGNOSIS — R1032 Left lower quadrant pain: Secondary | ICD-10-CM | POA: Diagnosis present

## 2021-03-13 LAB — URINALYSIS, ROUTINE W REFLEX MICROSCOPIC
Bilirubin Urine: NEGATIVE
Glucose, UA: NEGATIVE mg/dL
Ketones, ur: NEGATIVE mg/dL
Leukocytes,Ua: NEGATIVE
Nitrite: NEGATIVE
Protein, ur: NEGATIVE mg/dL
Specific Gravity, Urine: >=1.03 (ref 1.005–1.030)
pH: 5.5 (ref 5.0–8.0)

## 2021-03-13 LAB — CBC WITH DIFFERENTIAL/PLATELET
Abs Immature Granulocytes: 0.08 10*3/uL — ABNORMAL HIGH (ref 0.00–0.07)
Basophils Absolute: 0 10*3/uL (ref 0.0–0.1)
Basophils Relative: 1 %
Eosinophils Absolute: 0.1 10*3/uL (ref 0.0–0.5)
Eosinophils Relative: 2 %
HCT: 43.1 % (ref 39.0–52.0)
Hemoglobin: 14.9 g/dL (ref 13.0–17.0)
Immature Granulocytes: 1 %
Lymphocytes Relative: 26 %
Lymphs Abs: 2 10*3/uL (ref 0.7–4.0)
MCH: 30.8 pg (ref 26.0–34.0)
MCHC: 34.6 g/dL (ref 30.0–36.0)
MCV: 89.2 fL (ref 80.0–100.0)
Monocytes Absolute: 0.7 10*3/uL (ref 0.1–1.0)
Monocytes Relative: 9 %
Neutro Abs: 4.7 10*3/uL (ref 1.7–7.7)
Neutrophils Relative %: 61 %
Platelets: 208 10*3/uL (ref 150–400)
RBC: 4.83 MIL/uL (ref 4.22–5.81)
RDW: 12.3 % (ref 11.5–15.5)
WBC: 7.7 10*3/uL (ref 4.0–10.5)
nRBC: 0 % (ref 0.0–0.2)

## 2021-03-13 LAB — COMPREHENSIVE METABOLIC PANEL
ALT: 23 U/L (ref 0–44)
AST: 22 U/L (ref 15–41)
Albumin: 4.2 g/dL (ref 3.5–5.0)
Alkaline Phosphatase: 44 U/L (ref 38–126)
Anion gap: 8 (ref 5–15)
BUN: 24 mg/dL — ABNORMAL HIGH (ref 6–20)
CO2: 23 mmol/L (ref 22–32)
Calcium: 8.9 mg/dL (ref 8.9–10.3)
Chloride: 105 mmol/L (ref 98–111)
Creatinine, Ser: 1 mg/dL (ref 0.61–1.24)
GFR, Estimated: >60 mL/min (ref >60)
Glucose, Bld: 91 mg/dL (ref 70–99)
Potassium: 3.6 mmol/L (ref 3.5–5.1)
Sodium: 136 mmol/L (ref 135–145)
Total Bilirubin: 0.9 mg/dL (ref 0.3–1.2)
Total Protein: 7.3 g/dL (ref 6.5–8.1)

## 2021-03-13 LAB — URINALYSIS, MICROSCOPIC (REFLEX)

## 2021-03-13 LAB — LIPASE, BLOOD: Lipase: 44 U/L (ref 11–51)

## 2021-03-13 MED ORDER — SODIUM CHLORIDE 0.9 % IV BOLUS
1000.0000 mL | Freq: Once | INTRAVENOUS | Status: AC
  Administered 2021-03-13: 1000 mL via INTRAVENOUS

## 2021-03-13 MED ORDER — METRONIDAZOLE 500 MG PO TABS: 500.0000 mg | ORAL_TABLET | Freq: Two times a day (BID) | ORAL | 0 refills | Status: DC

## 2021-03-13 MED ORDER — ONDANSETRON 4 MG PO TBDP: 4.0000 mg | ORAL_TABLET | Freq: Three times a day (TID) | ORAL | 0 refills | Status: DC | PRN

## 2021-03-13 MED ORDER — METRONIDAZOLE 500 MG/100ML IV SOLN
500.0000 mg | Freq: Once | INTRAVENOUS | Status: AC
  Administered 2021-03-13: 500 mg via INTRAVENOUS
  Filled 2021-03-13: qty 100

## 2021-03-13 MED ORDER — CIPROFLOXACIN HCL 500 MG PO TABS: 500.0000 mg | ORAL_TABLET | Freq: Two times a day (BID) | ORAL | 0 refills | Status: DC

## 2021-03-13 MED ORDER — MORPHINE SULFATE (PF) 4 MG/ML IV SOLN
4.0000 mg | Freq: Once | INTRAVENOUS | Status: AC
  Administered 2021-03-13: 4 mg via INTRAVENOUS
  Filled 2021-03-13: qty 1

## 2021-03-13 MED ORDER — HYDROMORPHONE HCL 1 MG/ML IJ SOLN
1.0000 mg | Freq: Once | INTRAMUSCULAR | Status: AC
  Administered 2021-03-13: 1 mg via INTRAVENOUS
  Filled 2021-03-13: qty 1

## 2021-03-13 MED ORDER — OXYCODONE HCL 5 MG PO TABS: 5.0000 mg | ORAL_TABLET | ORAL | 0 refills | Status: DC | PRN

## 2021-03-13 MED ORDER — ONDANSETRON HCL 4 MG/2ML IJ SOLN
4.0000 mg | Freq: Once | INTRAMUSCULAR | Status: AC
  Administered 2021-03-13: 4 mg via INTRAVENOUS
  Filled 2021-03-13: qty 2

## 2021-03-13 MED ORDER — SODIUM CHLORIDE 0.9 % IV SOLN
2.0000 g | Freq: Once | INTRAVENOUS | Status: AC
  Administered 2021-03-13: 2 g via INTRAVENOUS
  Filled 2021-03-13: qty 20

## 2021-03-13 MED ORDER — IOHEXOL 300 MG/ML  SOLN
100.0000 mL | Freq: Once | INTRAMUSCULAR | Status: AC | PRN
  Administered 2021-03-13: 100 mL via INTRAVENOUS

## 2021-03-13 NOTE — ED Provider Notes (Signed)
[facility] Provider Note   CSN: [id number] Arrival date & time: 03/13/21  2051    History  Chief Complaint  Patient presents with   Abdominal Pain    Jordan Mclean is a 46 y.o. male here for evaluation of llq abd pain. Pain x 2 hours. Something similar 2 weeks ago which self resolved.  Started as a dull achy sensation to left lower abdomen subsequently proceeded to sharp and achy sensation.  No change in bowel movements.  Last BM today.  No melena or blood per rectum.  He denies any radiation to his back, flank, groin.  No dysuria, hematuria, history of stones, fever, emesis, CP, SOB, back pain, numbness, weakness.  Does have history of right inguinal repair as a child however no other surgical procedures.  No prior colonoscopy, hx of diverticulitis, kidney stones  HPI     Home Medications Prior to Admission medications   Medication Sig Start Date End Date Taking? Authorizing Provider  ciprofloxacin (CIPRO) 500 MG tablet Take 1 tablet (500 mg total) by mouth every 12 (twelve) hours. 03/13/21  Yes Ernestine Langworthy A, PA-C  metroNIDAZOLE (FLAGYL) 500 MG tablet Take 1 tablet (500 mg total) by mouth 2 (two) times daily. 03/13/21  Yes Nusrat Encarnacion A, PA-C  ondansetron (ZOFRAN-ODT) 4 MG disintegrating tablet Take 1 tablet (4 mg total) by mouth every 8 (eight) hours as needed for nausea or vomiting. 03/13/21  Yes Aarilyn Dye A, PA-C  oxyCODONE (ROXICODONE) 5 MG immediate release tablet Take 1 tablet (5 mg total) by mouth every 4 (four) hours as needed for severe pain. 03/13/21  Yes Nadav Swindell A, PA-C  atorvastatin (LIPITOR) 10 MG tablet Take 10 mg by mouth daily.      [provider]  Cetirizine HCl 10 MG CAPS Take 1 capsule (10 mg total) by mouth daily. 09/21/18   Wieters, Hallie C, PA-C  dextromethorphan-guaiFENesin (MUCINEX DM) 30-600 MG 12hr tablet Take 1 tablet by mouth 2 (two) times daily. 09/21/18   Wieters, Hallie C, PA-C  doxycycline  (VIBRAMYCIN) 100 MG capsule Take 1 capsule (100 mg total) by mouth 2 (two) times daily. 09/25/18   Wieters, Hallie C, PA-C  HYDROcodone-homatropine (HYCODAN) 5-1.5 MG/5ML syrup Take 5 mLs by mouth at bedtime as needed for cough. 09/21/18   Wieters, Hallie C, PA-C  losartan (COZAAR) 50 MG tablet Take 50 mg by mouth daily.    [provider]      Allergies    Niacin and related    Review of Systems   Review of Systems  Constitutional: Negative.   HENT: Negative.    Cardiovascular: Negative.   Gastrointestinal:  Positive for abdominal pain. Negative for abdominal distention, anal bleeding, blood in stool, constipation, diarrhea, nausea, rectal pain and vomiting.  Genitourinary: Negative.   Musculoskeletal: Negative.   Skin: Negative.   Neurological: Negative.   All other systems reviewed and are negative.  Physical Exam Updated Vital Signs BP 129/85    Pulse 66    Temp 98.2 F (36.8 C) (Oral)    Resp 18    Ht 5\' 8"  (1.727 m)    Wt 85.3 kg    SpO2 99%    BMI 28.59 kg/m  Physical Exam Vitals and nursing note reviewed.  Constitutional:      General: He is not in acute distress.    Appearance: He is well-developed. He is not ill-appearing, toxic-appearing or diaphoretic.  HENT:     Head: Normocephalic and atraumatic.  Mouth/Throat:     Mouth: Mucous membranes are moist.  Eyes:     Pupils: Pupils are equal, round, and reactive to light.  Cardiovascular:     Rate and Rhythm: Normal rate and regular rhythm.     Heart sounds: Normal heart sounds.  Pulmonary:     Effort: Pulmonary effort is normal. No respiratory distress.     Breath sounds: Normal breath sounds.  Abdominal:     General: Bowel sounds are normal. There is no distension.     Palpations: Abdomen is soft.     Tenderness: There is abdominal tenderness in the left lower quadrant. There is no right CVA tenderness, left CVA tenderness, guarding or rebound. Negative signs include Murphy's sign and McBurney's sign.      Hernia: No hernia is present.  Musculoskeletal:        General: Normal range of motion.     Cervical back: Normal range of motion and neck supple.  Skin:    General: Skin is warm and dry.  Neurological:     General: No focal deficit present.     Mental Status: He is alert and oriented to person, place, and time.    ED Results / Procedures / Treatments   Labs (all labs ordered are listed, but only abnormal results are displayed) Labs Reviewed  CBC WITH DIFFERENTIAL/PLATELET - Abnormal; Notable for the following components:      Result Value   Abs Immature Granulocytes 0.08 (*)    All other components within normal limits  COMPREHENSIVE METABOLIC PANEL - Abnormal; Notable for the following components:   BUN 24 (*)    All other components within normal limits  URINALYSIS, ROUTINE W REFLEX MICROSCOPIC - Abnormal; Notable for the following components:   Hgb urine dipstick TRACE (*)    All other components within normal limits  URINALYSIS, MICROSCOPIC (REFLEX) - Abnormal; Notable for the following components:   Bacteria, UA RARE (*)    All other components within normal limits  LIPASE, BLOOD    EKG None  Radiology CT ABDOMEN PELVIS W CONTRAST  Result Date: 03/13/2021 CLINICAL DATA:  Left lower quadrant abdominal pain EXAM: CT ABDOMEN AND PELVIS WITH CONTRAST TECHNIQUE: Multidetector CT imaging of the abdomen and pelvis was performed using the standard protocol following bolus administration of intravenous contrast. RADIATION DOSE REDUCTION: This exam was performed according to the departmental dose-optimization program which includes automated exposure control, adjustment of the mA and/or kV according to patient size and/or use of iterative reconstruction technique. CONTRAST:  124mL OMNIPAQUE IOHEXOL 300 MG/ML  SOLN COMPARISON:  03/06/2015 FINDINGS: Lower chest: No acute abnormality. Hepatobiliary: No focal liver abnormality is seen. No gallstones, gallbladder wall thickening, or  biliary dilatation. Pancreas: Unremarkable Spleen: Unremarkable Adrenals/Urinary Tract: Adrenal glands are unremarkable. Kidneys are normal, without renal calculi, focal lesion, or hydronephrosis. Bladder is unremarkable. Stomach/Bowel: Focal pericolonic inflammatory changes are seen involving the proximal sigmoid colon, best appreciated on axial image # 64/2 in keeping with changes of mild uncomplicated sigmoid diverticulitis. There is moderate background descending and sigmoid colonic diverticulosis. No evidence of obstruction. No free intraperitoneal gas or fluid. No loculated intra-abdominal fluid collections are identified. Stomach, small bowel, and large bowel are otherwise unremarkable. Appendix normal. Vascular/Lymphatic: No significant vascular findings are present. No enlarged abdominal or pelvic lymph nodes. Reproductive: Prostate is unremarkable. Other: Moderate fat containing left inguinal hernia. Rectum unremarkable. Musculoskeletal: No acute or significant osseous findings. IMPRESSION: Mild uncomplicated sigmoid diverticulitis. Background moderate distal colonic diverticulosis. No evidence of  obstruction or perforation. Moderate fat containing left inguinal hernia. Electronically Signed   By: Fidela Salisbury M.D.   On: 03/13/2021 22:31    Procedures Procedures    Medications Ordered in ED Medications  cefTRIAXone (ROCEPHIN) 2 g in sodium chloride 0.9 % 100 mL IVPB (2 g Intravenous New Bag/Given 03/13/21 2304)    And  metroNIDAZOLE (FLAGYL) IVPB 500 mg (has no administration in time range)  sodium chloride 0.9 % bolus 1,000 mL (1,000 mLs Intravenous New Bag/Given 03/13/21 2120)  ondansetron (ZOFRAN) injection 4 mg (4 mg Intravenous Given 03/13/21 2119)  morphine (PF) 4 MG/ML injection 4 mg (4 mg Intravenous Given 03/13/21 2120)  iohexol (OMNIPAQUE) 300 MG/ML solution 100 mL (100 mLs Intravenous Contrast Given 03/13/21 2213)  HYDROmorphone (DILAUDID) injection 1 mg (1 mg Intravenous Given  03/13/21 2257)   ED Course/ Medical Decision Making/ A&P    46 year old here for evaluation of left lower quadrant pain which began few hours PTA.  No change in bowel movement.  He is afebrile, nonseptic, not ill-appearing.  Something similar 2 weeks ago which resolved.  He denies any urinary complaints.  No swelling, pain to scrotum.  No history of AAA, dissection.  He denies any back pain, chest pain, numbness or weakness.   Labs and imaging personally viewed and interpreted:  CBC without leukocytosis CMP BUN 24 no additional abnormality Lipase 44 UA neg for infection CT AP with uncomplicated diverticulitis  Patient reassessed. Pain initially improved however now still moderate after moving for CT scan. Will given additional pain management. Discussed labs and imaging with patient in room.  We will treat for diverticulitis.  Goal for patient is to get pain better managed so he may attempt him treatment with p.o. antibiotics.  I feel this is reasonable.  We will plan on recheck after additional meds.  Patient reassessed.  Pain controlled.  Tolerating p.o. intake.  Will DC home with symptomatic management after completion of IV antibiotics here.  He will return for new or worsening symptoms.  I did discuss close follow-up with PCP as he will need colonoscopy follow-up outpatient.                          Medical Decision Making Amount and/or Complexity of Data Reviewed Independent Historian:     Details: family in room External Data Reviewed: labs, radiology and notes. Labs: ordered. Decision-making details documented in ED Course. Radiology: ordered and independent interpretation performed. Decision-making details documented in ED Course.  Risk OTC drugs. Prescription drug management.          Final Clinical Impression(s) / ED Diagnoses Final diagnoses:  Diverticulitis    Rx / DC Orders ED Discharge Orders          Ordered    oxyCODONE (ROXICODONE) 5 MG immediate  release tablet  Every 4 hours PRN        03/13/21 2330    ondansetron (ZOFRAN-ODT) 4 MG disintegrating tablet  Every 8 hours PRN        03/13/21 2330    ciprofloxacin (CIPRO) 500 MG tablet  Every 12 hours        03/13/21 2330    metroNIDAZOLE (FLAGYL) 500 MG tablet  2 times daily        03/13/21 2330              Nakita Santerre A, PA-C 03/13/21 2332    Dorie Rank, MD 03/14/21 [phone]

## 2021-03-13 NOTE — ED Triage Notes (Signed)
Severe LLQ pain x 2 hours. Reports similar pain a few weeks ago which resolved on it's own

## 2021-03-13 NOTE — Discharge Instructions (Addendum)
Take the antibiotics as prescribed.  Have also prescribed you some pain medicine.  Take as prescribed.  Please use caution as this medication is an opioid and may become addictive.  Return for any new or worsening symptoms.  Make sure to follow-up with your primary care provider as you will need a gastroenterology referral to have a colonoscopy performed.

## 2021-03-14 NOTE — ED Notes (Signed)
Discharge instructions including prescription, pain management, and follow up care discussed with pt. Pt verbalized understanding with no questions at this time. Pt to go home with family at bedside

## 2021-05-03 LAB — HM COLONOSCOPY

## 2021-05-11 LAB — HM DIABETES EYE EXAM

## 2021-10-14 DIAGNOSIS — M79641 Pain in right hand: Secondary | ICD-10-CM | POA: Insufficient documentation

## 2021-11-10 DIAGNOSIS — G5603 Carpal tunnel syndrome, bilateral upper limbs: Secondary | ICD-10-CM | POA: Insufficient documentation

## 2021-11-15 ENCOUNTER — Ambulatory Visit
Admission: EM | Admit: 2021-11-15 | Discharge: 2021-11-15 | Disposition: A | Discharge status: Home / Self Care | Payer: 59

## 2021-11-15 DIAGNOSIS — J309 Allergic rhinitis, unspecified: Secondary | ICD-10-CM

## 2021-11-15 DIAGNOSIS — J3081 Allergic rhinitis due to animal (cat) (dog) hair and dander: Secondary | ICD-10-CM | POA: Diagnosis not present

## 2021-11-15 DIAGNOSIS — R069 Unspecified abnormalities of breathing: Secondary | ICD-10-CM

## 2021-11-15 DIAGNOSIS — R058 Other specified cough: Secondary | ICD-10-CM

## 2021-11-15 MED ORDER — ALBUTEROL SULFATE (2.5 MG/3ML) 0.083% IN NEBU
2.5000 mg | INHALATION_SOLUTION | Freq: Once | RESPIRATORY_TRACT | Status: AC
  Administered 2021-11-15: 2.5 mg via RESPIRATORY_TRACT

## 2021-11-15 MED ORDER — FLUTICASONE PROPIONATE 50 MCG/ACT NA SUSP: 1.0000 | Freq: Every day | NASAL | 1 refills | Status: DC

## 2021-11-15 MED ORDER — METHYLPREDNISOLONE 4 MG PO TABS: ORAL_TABLET | ORAL | 0 refills | Status: AC

## 2021-11-15 MED ORDER — CETIRIZINE HCL 10 MG PO TABS: 10.0000 mg | ORAL_TABLET | Freq: Every day | ORAL | 1 refills | Status: DC

## 2021-11-15 MED ORDER — IPRATROPIUM BROMIDE 0.06 % NA SOLN: 2.0000 | Freq: Three times a day (TID) | NASAL | 1 refills | Status: DC

## 2021-11-15 NOTE — ED Provider Notes (Signed)
[facility]    CSN: [id number] Arrival date & time: 11/15/21  1557    HISTORY   Chief Complaint  Patient presents with   Cough   Ear Fullness   Nasal Congestion   HPI Jordan Mclean is a pleasant, 46 y.o. male who presents to urgent care today. Patient complains of 1 month history of nonproductive cough, nasal congestion and fullness in his ear.  Patient states he has been taking Tylenol and Mucinex for relief of his symptoms.  Patient states a few weeks ago he took a 6-day course of tapering dose of prednisone to treat right lateral epicondylitis, states he noticed that his cough and work of breathing improved while taking it but after completing the 6-day treatment his symptoms return.  Patient denies fever, aches, chills, nausea, vomiting, diarrhea.  Patient denies history of allergies and asthma.  Patient has elevated blood pressure on arrival today.  Patient appears well.  Patient reports allergy to cats, states he has a cat at home, not currently taking any allergy medications for this known allergy.  The history is provided by the patient.   Past Medical History:  Diagnosis Date   Hyperlipemia    Hypertension    There are no problems to display for this patient.  Past Surgical History:  Procedure Laterality Date   INGUINAL HERNIA REPAIR      Home Medications    Prior to Admission medications   Medication Sig Start Date End Date Taking? Authorizing Provider  metFORMIN (GLUCOPHAGE) 500 MG tablet  12/14/20  Yes [provider]  topiramate (TOPAMAX) 25 MG tablet  03/09/21  Yes [provider]  atorvastatin (LIPITOR) 10 MG tablet Take 10 mg by mouth daily.      [provider]  Cetirizine HCl 10 MG CAPS Take 1 capsule (10 mg total) by mouth daily. 09/21/18   Wieters, Hallie C, PA-C  escitalopram (LEXAPRO) 5 MG tablet     [provider]  losartan (COZAAR) 50 MG tablet Take 50 mg by mouth daily.    [provider]   Testosterone 25 MG/2.5GM (1%) GEL Apply topically.    [provider]    Family History Family History  Problem Relation Age of Onset   CAD Father 60       MI   Hypertension Father    COPD Father    Hypertension Brother    Heart disease Son    Diabetes Mother    Social History Social History   Tobacco Use   Smoking status: Never   Smokeless tobacco: Never  Vaping Use   Vaping Use: Never used  Substance Use Topics   Alcohol use: Yes    Alcohol/week: 1.0 standard drink of alcohol    Types: 1 Standard drinks or equivalent per week    Comment: 1x week   Drug use: Never   Allergies   Niacin and related  Review of Systems Review of Systems Pertinent findings revealed after performing a 14 point review of systems has been noted in the history of present illness.  Physical Exam Triage Vital Signs ED Triage Vitals  Enc Vitals Group     BP 11/13/20 0827 (!) 147/82     Pulse Rate 11/13/20 0827 72     Resp 11/13/20 0827 18     Temp 11/13/20 0827 98.3 F (36.8 C)     Temp Source 11/13/20 0827 Oral     SpO2 11/13/20 0827 98 %     Weight --  Height --      Head Circumference --      Peak Flow --      Pain Score 11/13/20 0826 5     Pain Loc --      Pain Edu? --      Excl. in GC? --   No data found.  Updated Vital Signs BP (!) 141/89 (BP Location: Right Arm)   Pulse 81   Temp 98.5 F (36.9 C) (Oral)   Resp 16   SpO2 96%   Physical Exam Vitals and nursing note reviewed.  Constitutional:      General: He is not in acute distress.    Appearance: Normal appearance. He is not ill-appearing.  HENT:     Head: Normocephalic and atraumatic.     Salivary Glands: Right salivary gland is not diffusely enlarged or tender. Left salivary gland is not diffusely enlarged or tender.     Right Ear: Ear canal and external ear normal. No drainage. A middle ear effusion is present. There is no impacted cerumen. Tympanic membrane is bulging. Tympanic membrane is not  injected or erythematous.     Left Ear: Ear canal and external ear normal. No drainage. A middle ear effusion is present. There is no impacted cerumen. Tympanic membrane is bulging. Tympanic membrane is not injected or erythematous.     Ears:     Comments: Bilateral EACs normal, both TMs bulging with clear fluid    Nose: Rhinorrhea present. No nasal deformity, septal deviation, signs of injury, nasal tenderness, mucosal edema or congestion. Rhinorrhea is clear.     Right Nostril: Occlusion present. No foreign body, epistaxis or septal hematoma.     Left Nostril: Occlusion present. No foreign body, epistaxis or septal hematoma.     Right Turbinates: Enlarged, swollen and pale.     Left Turbinates: Enlarged, swollen and pale.     Right Sinus: No maxillary sinus tenderness or frontal sinus tenderness.     Left Sinus: No maxillary sinus tenderness or frontal sinus tenderness.     Mouth/Throat:     Lips: Pink. No lesions.     Mouth: Mucous membranes are moist. No oral lesions.     Pharynx: Oropharynx is clear. Uvula midline. No posterior oropharyngeal erythema or uvula swelling.     Tonsils: No tonsillar exudate. 0 on the right. 0 on the left.     Comments: Postnasal drip Eyes:     General: Lids are normal.        Right eye: No discharge.        Left eye: No discharge.     Extraocular Movements: Extraocular movements intact.     Conjunctiva/sclera: Conjunctivae normal.     Right eye: Right conjunctiva is not injected.     Left eye: Left conjunctiva is not injected.  Neck:     Trachea: Trachea and phonation normal.  Cardiovascular:     Rate and Rhythm: Normal rate and regular rhythm.     Pulses: Normal pulses.     Heart sounds: Normal heart sounds. No murmur heard.    No friction rub. No gallop.  Pulmonary:     Effort: Pulmonary effort is normal. No tachypnea, bradypnea, accessory muscle usage, prolonged expiration, respiratory distress or retractions.     Breath sounds: No stridor,  decreased air movement or transmitted upper airway sounds. Examination of the right-upper field reveals decreased breath sounds. Examination of the left-upper field reveals decreased breath sounds. Examination of the right-middle field reveals decreased breath  sounds. Examination of the left-middle field reveals decreased breath sounds. Examination of the right-lower field reveals decreased breath sounds. Examination of the left-lower field reveals decreased breath sounds. Decreased breath sounds present. No wheezing, rhonchi or rales.     Comments: Repeat auscultation post nebulized bronchodilator treatment revealed mildly improved breath sounds, mild wheezing in the lower right lung field.  Patient stated he did not notice meaningful improvement of work of breathing. Chest:     Chest wall: No tenderness.  Musculoskeletal:        General: Normal range of motion.     Cervical back: Normal range of motion and neck supple. Normal range of motion.  Lymphadenopathy:     Cervical: No cervical adenopathy.  Skin:    General: Skin is warm and dry.     Findings: No erythema or rash.  Neurological:     General: No focal deficit present.     Mental Status: He is alert and oriented to person, place, and time.  Psychiatric:        Mood and Affect: Mood normal.        Behavior: Behavior normal.     Visual Acuity Right Eye Distance:   Left Eye Distance:   Bilateral Distance:    Right Eye Near:   Left Eye Near:    Bilateral Near:     UC Couse / Diagnostics / Procedures:     Radiology No results found.  Procedures Procedures (including critical care time) EKG  Pending results:  Labs Reviewed - No data to display  Medications Ordered in UC: Medications  albuterol (PROVENTIL) (2.5 MG/3ML) 0.083% nebulizer solution 2.5 mg (2.5 mg Nebulization Given 11/15/21 1915)    UC Diagnoses / Final Clinical Impressions(s)   I have reviewed the triage vital signs and the nursing notes.  Pertinent  labs & imaging results that were available during my care of the patient were reviewed by me and considered in my medical decision making (see chart for details).    Final diagnoses:  Allergic rhinitis due to animal hair and dander  Allergic rhinitis with postnasal drip  Nonproductive cough   Patient provided with prolonged course of methylprednisolone given recent exposure to prednisone for lateral epicondylitis and his report of temporary improvement of work of breathing while taking 6-day course of prednisone.  Patient also advised to begin cetirizine, Flonase and ipratropium for upper respiratory allergy symptoms.  Patient advised that if he begins to notice he is running fever, begins to feel more fatigued, begins to have purulent drainage or cough productive of dark sputum, he should return for repeat evaluation as he may require antibiotics.  At this time I do not see an indication for antibiotics or viral testing.  ED Prescriptions     Medication Sig Dispense Auth. Provider   cetirizine (ZYRTEC ALLERGY) 10 MG tablet Take 1 tablet (10 mg total) by mouth at bedtime. 90 tablet Lynden Oxford Scales, PA-C   ipratropium (ATROVENT) 0.06 % nasal spray Place 2 sprays into both nostrils 3 (three) times daily. As needed for nasal congestion, runny nose 15 mL Lynden Oxford Scales, PA-C   methylPREDNISolone (MEDROL) 4 MG tablet Take 4 tablets (16 mg total) by mouth 2 (two) times daily for 3 days, THEN 3 tablets (12 mg total) daily for 3 days, THEN 2 tablets (8 mg total) daily for 3 days, THEN 1 tablet (4 mg total) daily for 3 days. 42 tablet Lynden Oxford Scales, PA-C   fluticasone (FLONASE) 50 MCG/ACT nasal spray  Place 1 spray into both nostrils daily. 47.4 mL Theadora RamaMorgan, Metzli Pollick Scales, PA-C      PDMP not reviewed this encounter.  Disposition Upon Discharge:  Condition: stable for discharge home Home: take medications as prescribed; routine discharge instructions as discussed; follow up as  advised.  Patient presented with an acute illness with associated systemic symptoms and significant discomfort requiring urgent management. In my opinion, this is a condition that a prudent lay person (someone who possesses an average knowledge of health and medicine) may potentially expect to result in complications if not addressed urgently such as respiratory distress, impairment of bodily function or dysfunction of bodily organs.   Routine symptom specific, illness specific and/or disease specific instructions were discussed with the patient and/or caregiver at length.   As such, the patient has been evaluated and assessed, work-up was performed and treatment was provided in alignment with urgent care protocols and evidence based medicine.  Patient/parent/caregiver has been advised that the patient may require follow up for further testing and treatment if the symptoms continue in spite of treatment, as clinically indicated and appropriate.  If the patient was tested for COVID-19, Influenza and/or RSV, then the patient/parent/guardian was advised to isolate at home pending the results of his/her diagnostic coronavirus test and potentially longer if they're positive. I have also advised pt that if his/her COVID-19 test returns positive, it's recommended to self-isolate for at least 10 days after symptoms first appeared AND until fever-free for 24 hours without fever reducer AND other symptoms have improved or resolved. Discussed self-isolation recommendations as well as instructions for household member/close contacts as per the [facility] and [place] DHHS, and also gave patient the COVID packet with this information.  Patient/parent/caregiver has been [facility] in 3-5 days if no better; to PCP or the Emergency Department if new signs and symptoms develop, or if the current signs or symptoms continue to change or worsen for further workup, evaluation and treatment as clinically indicated  and appropriate  The patient will follow up with their current PCP if and as advised. If the patient does not currently have a PCP we will assist them in obtaining one.   The patient may need specialty follow up if the symptoms continue, in spite of conservative treatment and management, for further workup, evaluation, consultation and treatment as clinically indicated and appropriate.  Patient/parent/caregiver verbalized understanding and agreement of plan as discussed.  All questions were addressed during visit.  Please see discharge instructions below for further details of plan.  Discharge Instructions:   Discharge Instructions      Your symptoms and my physical exam findings are concerning for exacerbation of your underlying allergies.     Please see the list below for recommended medications, dosages and frequencies to provide relief of current symptoms:     Medrol Dosepak (methylprednisolone): This is a steroid that will significantly calm your upper and lower airways, please take the daily recommended quantity of tablets daily with your breakfast meal starting tomorrow morning until the prescription is complete.      Zyrtec (cetirizine): This is an excellent second-generation antihistamine that helps to reduce respiratory inflammatory response to environmental allergens.  In some patients, this medication can cause daytime sleepiness so I recommend that you take 1 tablet daily at bedtime.     Flonase (fluticasone): This is a steroid nasal spray that you use once daily, 1 spray in each nare.  This medication does not work well if you  decide to use it only used as you feel you need to, it works best used on a daily basis.  After 3 to 5 days of use, you will notice significant reduction of the inflammation and mucus production that is currently being caused by exposure to allergens, whether seasonal or environmental.  The most common side effect of this medication is nosebleeds.  If you  experience a nosebleed, please discontinue use for 1 week, then feel free to resume.  I have provided you with a prescription.     Atrovent (ipratropium): This is an excellent nasal decongestant spray I have added to your recommended nasal steroid that will not cause rebound congestion, please instill 2 sprays into each nare with each use.  Because nasal steroids can take several days before they begin to provide full benefit, I recommend that you use this spray in addition to the nasal steroid prescribed for you.  Please use it after you have used your nasal steroid and repeat up to 4 times daily as needed.  I have provided you with a prescription for this medication.      If you find that your health insurance will not pay for allergy medications, please consider downloading the GoodRx app and using to get a better price than the "off the shelf" price.     If you find that you have not had improvement of your symptoms in the next 5 to 7 days, or if your symptoms worsen, please follow-up with your primary care provider or return here to urgent care for repeat evaluation and further recommendations.   Thank you for visiting urgent care today.  We appreciate the opportunity to participate in your care.         This office note has been dictated using Teaching laboratory technician.  Unfortunately, this method of dictation can sometimes lead to typographical or grammatical errors.  I apologize for your inconvenience in advance if this occurs.  Please do not hesitate to reach out to me if clarification is needed.      Theadora Rama Scales, [place] 11/16/21 [phone]

## 2021-11-15 NOTE — ED Triage Notes (Signed)
The patient states for about a month he has been having a cough, congestion, and ear fullness.  Home interventions: tylenol, mucinex

## 2021-11-15 NOTE — Discharge Instructions (Signed)
Your symptoms and my physical exam findings are concerning for exacerbation of your underlying allergies.     Please see the list below for recommended medications, dosages and frequencies to provide relief of current symptoms:     Medrol Dosepak (methylprednisolone): This is a steroid that will significantly calm your upper and lower airways, please take the daily recommended quantity of tablets daily with your breakfast meal starting tomorrow morning until the prescription is complete.      Zyrtec (cetirizine): This is an excellent second-generation antihistamine that helps to reduce respiratory inflammatory response to environmental allergens.  In some patients, this medication can cause daytime sleepiness so I recommend that you take 1 tablet daily at bedtime.     Flonase (fluticasone): This is a steroid nasal spray that you use once daily, 1 spray in each nare.  This medication does not work well if you decide to use it only used as you feel you need to, it works best used on a daily basis.  After 3 to 5 days of use, you will notice significant reduction of the inflammation and mucus production that is currently being caused by exposure to allergens, whether seasonal or environmental.  The most common side effect of this medication is nosebleeds.  If you experience a nosebleed, please discontinue use for 1 week, then feel free to resume.  I have provided you with a prescription.     Atrovent (ipratropium): This is an excellent nasal decongestant spray I have added to your recommended nasal steroid that will not cause rebound congestion, please instill 2 sprays into each nare with each use.  Because nasal steroids can take several days before they begin to provide full benefit, I recommend that you use this spray in addition to the nasal steroid prescribed for you.  Please use it after you have used your nasal steroid and repeat up to 4 times daily as needed.  I have provided you with a prescription  for this medication.      If you find that your health insurance will not pay for allergy medications, please consider downloading the GoodRx app and using to get a better price than the "off the shelf" price.     If you find that you have not had improvement of your symptoms in the next 5 to 7 days, or if your symptoms worsen, please follow-up with your primary care provider or return here to urgent care for repeat evaluation and further recommendations.   Thank you for visiting urgent care today.  We appreciate the opportunity to participate in your care.

## 2021-12-24 ENCOUNTER — Other Ambulatory Visit: Payer: Self-pay | Admitting: *Deleted

## 2022-01-24 ENCOUNTER — Encounter: Payer: Self-pay | Admitting: Family Medicine

## 2022-01-24 ENCOUNTER — Ambulatory Visit (INDEPENDENT_AMBULATORY_CARE_PROVIDER_SITE_OTHER): Payer: 59 | Admitting: Family Medicine

## 2022-01-24 VITALS — BP 136/87 | HR 88 | Wt 214.0 lb

## 2022-01-24 DIAGNOSIS — G4733 Obstructive sleep apnea (adult) (pediatric): Secondary | ICD-10-CM | POA: Diagnosis not present

## 2022-01-24 DIAGNOSIS — E291 Testicular hypofunction: Secondary | ICD-10-CM

## 2022-01-24 DIAGNOSIS — R000 Tachycardia, unspecified: Secondary | ICD-10-CM | POA: Diagnosis not present

## 2022-01-24 DIAGNOSIS — R5383 Other fatigue: Secondary | ICD-10-CM

## 2022-01-24 DIAGNOSIS — E785 Hyperlipidemia, unspecified: Secondary | ICD-10-CM | POA: Insufficient documentation

## 2022-01-24 DIAGNOSIS — Z23 Encounter for immunization: Secondary | ICD-10-CM

## 2022-01-24 DIAGNOSIS — E782 Mixed hyperlipidemia: Secondary | ICD-10-CM

## 2022-01-24 DIAGNOSIS — R7301 Impaired fasting glucose: Secondary | ICD-10-CM

## 2022-01-24 DIAGNOSIS — I10 Essential (primary) hypertension: Secondary | ICD-10-CM | POA: Insufficient documentation

## 2022-01-24 DIAGNOSIS — Z8719 Personal history of other diseases of the digestive system: Secondary | ICD-10-CM

## 2022-01-24 MED ORDER — TESTOSTERONE 1.62 % TD GEL: TRANSDERMAL | 5 refills | Status: DC

## 2022-01-24 NOTE — Assessment & Plan Note (Signed)
Last A1c was at goal.  Currently on metformin.

## 2022-01-24 NOTE — Progress Notes (Signed)
New Patient Office Visit  Subjective    Patient ID: Jordan Mclean, male    DOB: 1975/09/11  Age: 47 y.o. MRN: [id number]  CC:  Chief Complaint  Patient presents with   Establish Care    HPI Jordan Mclean presents to establish care  He has a history of prediabetes and has been on metformin and topiramate for a while.  He has a history of hypogonadism and has been on testosterone replacement therapy for couple years.  Initially started out with injections and switch to topical but has been out for couple of months.  Last time he had lab work was in August 2023.  He also takes atorvastatin 40 mg and a low-dose of escitalopram.  He is just concerned because the last several months he is just felt more tired and lethargic.  He had difficulty feeling motivated during the day.  He has had episodes similar to this in the past.  Last cardiac workup was probably greater than 5 years ago.  Also feels he is just experiencing some brain fog as well.  He is interested in getting back on his to test testosterone replacement therapy.  So concerned that the sleep apnea is not being well treated he has been getting some :-( is in the morning saying that he is having more frequent leaks.  He does follow with a board-certified sleep specialist for this.  His previous provider which was Dr. Carlena Sax has retired but he is supposed to get scheduled with one of the new providers there to follow-up on this.  Does have a diagnosis of severe sleep apnea and at 1 point even considered having surgery with an oral maxillofacial surgeon but his insurance would not cover it at that time which was about 5 years ago.  Occasionally left arm goes numb while lying in bed.  Says he was also diagnosed with diverticulitis in February 2023.  Outpatient Encounter Medications as of 01/24/2022  Medication Sig   atorvastatin (LIPITOR) 40 MG tablet Take 40 mg by mouth daily.   cetirizine (ZYRTEC ALLERGY) 10 MG tablet Take 1 tablet  (10 mg total) by mouth at bedtime.   escitalopram (LEXAPRO) 5 MG tablet    fluticasone (FLONASE) 50 MCG/ACT nasal spray Place 1 spray into both nostrils daily.   losartan (COZAAR) 50 MG tablet Take 50 mg by mouth daily.   metFORMIN (GLUCOPHAGE) 500 MG tablet    topiramate (TOPAMAX) 25 MG tablet    [DISCONTINUED] ipratropium (ATROVENT) 0.06 % nasal spray Place 2 sprays into both nostrils 3 (three) times daily. As needed for nasal congestion, runny nose   [DISCONTINUED] Testosterone 1.62 % GEL 2 pumps to skin in the morning to shoulder, upper arms or abdomen   Testosterone 1.62 % GEL 2 pumps to skin in the morning to shoulder, upper arms or abdomen   [DISCONTINUED] Testosterone 25 MG/2.5GM (1%) GEL Apply topically.   No facility-administered encounter medications on file as of 01/24/2022.    Past Medical History:  Diagnosis Date   Hyperlipemia    Hypertension     Past Surgical History:  Procedure Laterality Date   INGUINAL HERNIA REPAIR Left 2023   INGUINAL HERNIA REPAIR Right    Age 10   VASECTOMY      Family History  Problem Relation Age of Onset   CAD Father 55       MI   Hypertension Father    COPD Father    Hypertension Brother    Heart disease  Son    Diabetes Mother     Social History   Socioeconomic History   Marital status: Married    Spouse name: Not on file   Number of children: Not on file   Years of education: Not on file   Highest education level: Not on file  Occupational History   Not on file  Tobacco Use   Smoking status: Never   Smokeless tobacco: Never  Vaping Use   Vaping Use: Never used  Substance and Sexual Activity   Alcohol use: Yes    Alcohol/week: 1.0 standard drink of alcohol    Types: 1 Standard drinks or equivalent per week    Comment: 1x week   Drug use: Never   Sexual activity: Not on file  Other Topics Concern   Not on file  Social History Narrative   Not on file   Social Determinants of Health   Financial Resource Strain:  Not on file  Food Insecurity: Not on file  Transportation Needs: Not on file  Physical Activity: Not on file  Stress: Not on file  Social Connections: Not on file  Intimate Partner Violence: Not on file    Review of Systems  Constitutional:        Weight gain  Psychiatric/Behavioral:  The patient has insomnia.   All other systems reviewed and are negative.       Objective    BP 136/87   Pulse 88   Wt 214 lb (97.1 kg)   SpO2 97%   BMI 32.54 kg/m   Physical Exam Constitutional:      Appearance: He is well-developed.  HENT:     Head: Normocephalic and atraumatic.  Cardiovascular:     Rate and Rhythm: Normal rate and regular rhythm.     Heart sounds: Normal heart sounds.  Pulmonary:     Effort: Pulmonary effort is normal.     Breath sounds: Normal breath sounds.  Skin:    General: Skin is warm and dry.  Neurological:     Mental Status: He is alert and oriented to person, place, and time.  Psychiatric:        Behavior: Behavior normal.         Assessment & Plan:   Problem List Items Addressed This Visit       Respiratory   Obstructive sleep apnea    Follows with sleep medicine for yet for as.  Would like to get a copy of his sleep study will look through his records.        Endocrine   IFG (impaired fasting glucose)    Last A1c was at goal.  Currently on metformin.      Relevant Orders   CBC with Differential/Platelet   TSH   COMPLETE METABOLIC PANEL WITH GFR   Urinalysis, Routine w reflex microscopic   Exercise Tolerance Test   ECHOCARDIOGRAM COMPLETE   Cardiac Stress Test: Informed Consent Details: Physician/Practitioner Attestation; Transcribe to consent form and obtain patient signature   Hypogonadism in male    Start topical testosterone daily.  Apply in the mornings.  Will plan to recheck testosterone level in about a month to make sure that his levels are adequate and see if this improves any of his low energy and lethargy and brain fog  that he is currently struggling with.      Relevant Medications   Testosterone 1.62 % GEL   Other Relevant Orders   CBC with Differential/Platelet   TSH   COMPLETE  METABOLIC PANEL WITH GFR   Urinalysis, Routine w reflex microscopic     Other   Hyperlipidemia   Relevant Medications   atorvastatin (LIPITOR) 40 MG tablet   Other Relevant Orders   Cardiac Stress Test: Informed Consent Details: Physician/Practitioner Attestation; Transcribe to consent form and obtain patient signature   History of diverticulitis   Other Visit Diagnoses     Need for immunization against influenza    -  Primary   Relevant Orders   Flu Vaccine QUAD 55mo+IM (Fluarix, Fluzone & Alfiuria Quad PF) (Completed)   Other fatigue       Relevant Orders   CBC with Differential/Platelet   TSH   COMPLETE METABOLIC PANEL WITH GFR   Urinalysis, Routine w reflex microscopic   Exercise Tolerance Test   ECHOCARDIOGRAM COMPLETE   Cardiac Stress Test: Informed Consent Details: Physician/Practitioner Attestation; Transcribe to consent form and obtain patient signature   Tachycardia       Relevant Orders   CBC with Differential/Platelet   TSH   COMPLETE METABOLIC PANEL WITH GFR   Urinalysis, Routine w reflex microscopic   Exercise Tolerance Test   ECHOCARDIOGRAM COMPLETE   Cardiac Stress Test: Informed Consent Details: Physician/Practitioner Attestation; Transcribe to consent form and obtain patient signature       Return in about 2 months (around 03/25/2022) for fatigue and tachycardia.   Beatrice Lecher, MD

## 2022-01-24 NOTE — Assessment & Plan Note (Signed)
Start topical testosterone daily.  Apply in the mornings.  Will plan to recheck testosterone level in about a month to make sure that his levels are adequate and see if this improves any of his low energy and lethargy and brain fog that he is currently struggling with.

## 2022-01-24 NOTE — Assessment & Plan Note (Signed)
Follows with sleep medicine for yet for as.  Would like to get a copy of his sleep study will look through his records.

## 2022-01-25 LAB — CBC WITH DIFFERENTIAL/PLATELET
Absolute Monocytes: 650 cells/uL (ref 200–950)
Basophils Absolute: 57 cells/uL (ref 0–200)
Basophils Relative: 1 %
Eosinophils Absolute: 239 cells/uL (ref 15–500)
Eosinophils Relative: 4.2 %
HCT: 47.1 % (ref 38.5–50.0)
Hemoglobin: 16.1 g/dL (ref 13.2–17.1)
Lymphs Abs: 1733 cells/uL (ref 850–3900)
MCH: 30.9 pg (ref 27.0–33.0)
MCHC: 34.2 g/dL (ref 32.0–36.0)
MCV: 90.4 fL (ref 80.0–100.0)
MPV: 8.2 fL (ref 7.5–12.5)
Monocytes Relative: 11.4 %
Neutro Abs: 3021 cells/uL (ref 1500–7800)
Neutrophils Relative %: 53 %
Platelets: 251 10*3/uL (ref 140–400)
RBC: 5.21 10*6/uL (ref 4.20–5.80)
RDW: 12.4 % (ref 11.0–15.0)
Total Lymphocyte: 30.4 %
WBC: 5.7 10*3/uL (ref 3.8–10.8)

## 2022-01-25 LAB — COMPLETE METABOLIC PANEL WITH GFR
AG Ratio: 2 (calc) (ref 1.0–2.5)
ALT: 46 U/L (ref 9–46)
AST: 26 U/L (ref 10–40)
Albumin: 4.8 g/dL (ref 3.6–5.1)
Alkaline phosphatase (APISO): 61 U/L (ref 36–130)
BUN: 23 mg/dL (ref 7–25)
CO2: 28 mmol/L (ref 20–32)
Calcium: 9.5 mg/dL (ref 8.6–10.3)
Chloride: 104 mmol/L (ref 98–110)
Creat: 0.95 mg/dL (ref 0.60–1.29)
Globulin: 2.4 g/dL (calc) (ref 1.9–3.7)
Glucose, Bld: 128 mg/dL — ABNORMAL HIGH (ref 65–99)
Potassium: 4.1 mmol/L (ref 3.5–5.3)
Sodium: 141 mmol/L (ref 135–146)
Total Bilirubin: 0.9 mg/dL (ref 0.2–1.2)
Total Protein: 7.2 g/dL (ref 6.1–8.1)
eGFR: 99 mL/min/{1.73_m2} (ref >=60)

## 2022-01-25 LAB — URINALYSIS, ROUTINE W REFLEX MICROSCOPIC
Bilirubin Urine: NEGATIVE
Glucose, UA: NEGATIVE
Hgb urine dipstick: NEGATIVE
Ketones, ur: NEGATIVE
Leukocytes,Ua: NEGATIVE
Nitrite: NEGATIVE
Protein, ur: NEGATIVE
Specific Gravity, Urine: 1.024 (ref 1.001–1.035)
pH: 6.5 (ref 5.0–8.0)

## 2022-01-25 LAB — TSH: TSH: 1.51 mIU/L (ref 0.40–4.50)

## 2022-01-25 NOTE — Progress Notes (Signed)
Hi Greg, your labs look good so far.  Sign of thyroid problems or anemia.  We are working on getting you scheduled for the echo and an exercise tolerance test.  If you do not hear back from someone by the end of next week please let us know.

## 2022-02-10 ENCOUNTER — Encounter: Payer: Self-pay | Admitting: Family Medicine

## 2022-02-17 ENCOUNTER — Telehealth: Payer: 59 | Admitting: Physician Assistant

## 2022-02-17 ENCOUNTER — Encounter: Payer: Self-pay | Admitting: Family Medicine

## 2022-02-17 DIAGNOSIS — U071 COVID-19: Secondary | ICD-10-CM

## 2022-02-17 MED ORDER — NIRMATRELVIR/RITONAVIR (PAXLOVID)TABLET: 3.0000 | ORAL_TABLET | Freq: Two times a day (BID) | ORAL | 0 refills | Status: AC

## 2022-02-17 NOTE — Addendum Note (Signed)
Addended by: Brunetta Jeans on: 02/17/2022 12:51 PM   Modules accepted: Orders

## 2022-02-17 NOTE — Patient Instructions (Signed)
[name], thank you for joining [name], PA-C for today's virtual visit.  While this provider is not your primary care provider (PCP), if your PCP is located in our provider database this encounter information will be shared with them immediately following your visit.   [facility] account gives you access to today's visit and all your visits, tests, and labs performed at [facility] " click here if you don't have a [facility] account or go to [email or web address]  Consent: (Patient) Jordan Mclean provided verbal consent for this virtual visit at the beginning of the encounter.  Current Medications:  Current Outpatient Medications:    atorvastatin (LIPITOR) 40 MG tablet, Take 40 mg by mouth daily., Disp: , Rfl:    cetirizine (ZYRTEC ALLERGY) 10 MG tablet, Take 1 tablet (10 mg total) by mouth at bedtime., Disp: 90 tablet, Rfl: 1   escitalopram (LEXAPRO) 5 MG tablet, , Disp: , Rfl:    fluticasone (FLONASE) 50 MCG/ACT nasal spray, Place 1 spray into both nostrils daily., Disp: 47.4 mL, Rfl: 1   losartan (COZAAR) 50 MG tablet, Take 50 mg by mouth daily., Disp: , Rfl:    metFORMIN (GLUCOPHAGE) 500 MG tablet, , Disp: , Rfl:    Testosterone 1.62 % GEL, 2 pumps to skin in the morning to shoulder, upper arms or abdomen, Disp: 75 g, Rfl: 5   topiramate (TOPAMAX) 25 MG tablet, , Disp: , Rfl:    Medications ordered in this encounter:  No orders of the defined types were placed in this encounter.    *If you need refills on other medications prior to your next appointment, please contact your pharmacy*  Follow-Up: Call back or seek an in-person evaluation if the symptoms worsen or if the condition fails to improve as anticipated.  [place] [phone]  Other Instructions Please keep well-hydrated and get plenty of rest. Start a saline nasal rinse to flush out your nasal passages. You can use plain Mucinex to help thin  congestion. Make sure to hold off on taking your atorvastatin while on the paxlovid and for 5 additional days If you have a humidifier, running in the bedroom at night. I want you to start OTC vitamin D3 1000 units daily, vitamin C 1000 mg daily, and a zinc supplement. Please take prescribed medications as directed.  You have been enrolled in a MyChart symptom monitoring program. Please answer these questions daily so we can keep track of how you are doing.  You were to quarantine for 5 days from onset of your symptoms.  After day 5, if you have had no fever and you are feeling better, you can end quarantine but need to mask for an additional 5 days. After day 5 if you have a fever or are having significant symptoms, please quarantine for full 10 days.  If you note any worsening of symptoms, any significant shortness of breath or any chest pain, please seek ER evaluation ASAP.  Please do not delay care!  COVID-19: What to Do if You Are Sick If you test positive and are an older adult or someone who is at high risk of getting very sick from COVID-19, treatment may be available. Contact a healthcare provider right away after a positive test to determine if you are eligible, even if your symptoms are mild right now. You can also visit a Test to Treat location and, if eligible, receive a prescription from a provider. Don't delay: Treatment must be started  within the first few days to be effective. If you have a fever, cough, or other symptoms, you might have COVID-19. Most people have mild illness and are able to recover at home. If you are sick: Keep track of your symptoms. If you have an emergency warning sign (including trouble breathing), call 911. Steps to help prevent the spread of COVID-19 if you are sick If you are sick with COVID-19 or think you might have COVID-19, follow the steps below to care for yourself and to help protect other people in your home and community. Stay home except to  get medical care Stay home. Most people with COVID-19 have mild illness and can recover at home without medical care. Do not leave your home, except to get medical care. Do not visit public areas and do not go to places where you are unable to wear a mask. Take care of yourself. Get rest and stay hydrated. Take over-the-counter medicines, such as acetaminophen, to help you feel better. Stay in touch with your doctor. Call before you get medical care. Be sure to get care if you have trouble breathing, or have any other emergency warning signs, or if you think it is an emergency. Avoid public transportation, ride-sharing, or taxis if possible. Get tested If you have symptoms of COVID-19, get tested. While waiting for test results, stay away from others, including staying apart from those living in your household. Get tested as soon as possible after your symptoms start. Treatments may be available for people with COVID-19 who are at risk for becoming very sick. Don't delay: Treatment must be started early to be effective--some treatments must begin within 5 days of your first symptoms. Contact your healthcare provider right away if your test result is positive to determine if you are eligible. Self-tests are one of several options for testing for the virus that causes COVID-19 and may be more convenient than laboratory-based tests and point-of-care tests. Ask your healthcare provider or your local health department if you need help interpreting your test results. You can visit your state, tribal, local, and territorial health department's website to look for the latest local information on testing sites. Separate yourself from other people As much as possible, stay in a specific room and away from other people and pets in your home. If possible, you should use a separate bathroom. If you need to be around other people or animals in or outside of the home, wear a well-fitting mask. Tell your close contacts  that they may have been exposed to COVID-19. An infected person can spread COVID-19 starting 48 hours (or 2 days) before the person has any symptoms or tests positive. By letting your close contacts know they may have been exposed to COVID-19, you are helping to protect everyone. See COVID-19 and Animals if you have questions about pets. If you are diagnosed with COVID-19, someone from the health department may call you. Answer the call to slow the spread. Monitor your symptoms Symptoms of COVID-19 include fever, cough, or other symptoms. Follow care instructions from your healthcare provider and local health department. Your local health authorities may give instructions on checking your symptoms and reporting information. When to seek emergency medical attention Look for emergency warning signs* for COVID-19. If someone is showing any of these signs, seek emergency medical care immediately: Trouble breathing Persistent pain or pressure in the chest New confusion Inability to wake or stay awake Pale, gray, or blue-colored skin, lips, or nail beds, depending on skin  tone *This list is not all possible symptoms. Please call your medical provider for any other symptoms that are severe or concerning to you. Call 911 or call ahead to your local emergency facility: Notify the operator that you are seeking care for someone who has or may have COVID-19. Call ahead before visiting your doctor Call ahead. Many medical visits for routine care are being postponed or done by phone or telemedicine. If you have a medical appointment that cannot be postponed, call your doctor's office, and tell them you have or may have COVID-19. This will help the office protect themselves and other patients. If you are sick, wear a well-fitting mask You should wear a mask if you must be around other people or animals, including pets (even at home). Wear a mask with the best fit, protection, and comfort for you. You don't  need to wear the mask if you are alone. If you can't put on a mask (because of trouble breathing, for example), cover your coughs and sneezes in some other way. Try to stay at least 6 feet away from other people. This will help protect the people around you. Masks should not be placed on young children under age 28 years, anyone who has trouble breathing, or anyone who is not able to remove the mask without help. Cover your coughs and sneezes Cover your mouth and nose with a tissue when you cough or sneeze. Throw away used tissues in a lined trash can. Immediately wash your hands with soap and water for at least 20 seconds. If soap and water are not available, clean your hands with an alcohol-based hand sanitizer that contains at least 60% alcohol. Clean your hands often Wash your hands often with soap and water for at least 20 seconds. This is especially important after blowing your nose, coughing, or sneezing; going to the bathroom; and before eating or preparing food. Use hand sanitizer if soap and water are not available. Use an alcohol-based hand sanitizer with at least 60% alcohol, covering all surfaces of your hands and rubbing them together until they feel dry. Soap and water are the best option, especially if hands are visibly dirty. Avoid touching your eyes, nose, and mouth with unwashed hands. Handwashing Tips Avoid sharing personal household items Do not share dishes, drinking glasses, cups, eating utensils, towels, or bedding with other people in your home. Wash these items thoroughly after using them with soap and water or put in the dishwasher. Clean surfaces in your home regularly Clean and disinfect high-touch surfaces (for example, doorknobs, tables, handles, light switches, and countertops) in your "sick room" and bathroom. In shared spaces, you should clean and disinfect surfaces and items after each use by the person who is ill. If you are sick and cannot clean, a caregiver or  other person should only clean and disinfect the area around you (such as your bedroom and bathroom) on an as needed basis. Your caregiver/other person should wait as long as possible (at least several hours) and wear a mask before entering, cleaning, and disinfecting shared spaces that you use. Clean and disinfect areas that may have blood, stool, or body fluids on them. Use household cleaners and disinfectants. Clean visible dirty surfaces with household cleaners containing soap or detergent. Then, use a household disinfectant. Use a product from [employer] List N: Disinfectants for Coronavirus (OEUMP-53). Be sure to follow the instructions on the label to ensure safe and effective use of the product. Many products recommend keeping the surface  wet with a disinfectant for a certain period of time (look at "contact time" on the product label). You may also need to wear personal protective equipment, such as gloves, depending on the directions on the product label. Immediately after disinfecting, wash your hands with soap and water for 20 seconds. For completed guidance on cleaning and disinfecting your home, visit Complete Disinfection Guidance. Take steps to improve ventilation at home Improve ventilation (air flow) at home to help prevent from spreading COVID-19 to other people in your household. Clear out COVID-19 virus particles in the air by opening windows, using air filters, and turning on fans in your home. Use this interactive tool to learn how to improve air flow in your home. When you can be around others after being sick with COVID-19 Deciding when you can be around others is different for different situations. Find out when you can safely end home isolation. For any additional questions about your care, contact your healthcare provider or state or local health department. 04/07/2020 Content source: [facility] ([facility]), Division of Viral  Diseases This information is not intended to replace advice given to you by your health care provider. Make sure you discuss any questions you have with your health care provider. Document Revised: 05/21/2020 Document Reviewed: 05/21/2020 Elsevier Patient Education  2022 [employer].    If you have been instructed to have an in-person evaluation today at a local Urgent Care facility, please use the link below. It will take you to a list of all of our available [facility], including address, phone number and hours of operation. Please do not delay care.  [facility]  If you or a family member do not have a primary care provider, use the link below to schedule a visit and establish care. When you choose a [facility] primary care physician or advanced practice provider, you gain a long-term partner in health. Find a Primary Care Provider  Learn more about [facility]'s in-office and virtual care options: [facility]

## 2022-02-17 NOTE — Progress Notes (Signed)
Virtual Visit Consent   Jordan Mclean, you are scheduled for a virtual visit with a [facility] provider today. Just as with appointments in the office, your consent must be obtained to participate. Your consent will be active for this visit and any virtual visit you may have with one of our providers in the next 365 days. If you have a MyChart account, a copy of this consent can be sent to you electronically.  As this is a virtual visit, video technology does not allow for your provider to perform a traditional examination. This may limit your provider's ability to fully assess your condition. If your provider identifies any concerns that need to be evaluated in person or the need to arrange testing (such as labs, EKG, etc.), we will make arrangements to do so. Although advances in technology are sophisticated, we cannot ensure that it will always work on either your end or our end. If the connection with a video visit is poor, the visit may have to be switched to a telephone visit. With either a video or telephone visit, we are not always able to ensure that we have a secure connection.  By engaging in this virtual visit, you consent to the provision of healthcare and authorize for your insurance to be billed (if applicable) for the services provided during this visit. Depending on your insurance coverage, you may receive a charge related to this service.  I need to obtain your verbal consent now. Are you willing to proceed with your visit today? Jordan Mclean has provided verbal consent on 02/17/2022 for a virtual visit (video or telephone). [name], [place]  Date: 02/17/2022 8:39 AM  Virtual Visit via Video Note   I, [name], connected with  Jordan Mclean  ([id number], Sep 24, 1975) on 02/17/22 at  8:30 AM EST by a video-enabled telemedicine application and verified that I am speaking with the correct person using two identifiers.  Location: Patient: Virtual Visit Location  Patient: Home Provider: Virtual Visit Location Provider: Home Office   I discussed the limitations of evaluation and management by telemedicine and the availability of in person appointments. The patient expressed understanding and agreed to proceed.    History of Present Illness: Jordan Mclean is a 47 y.o. who identifies as a male who was assigned male at birth, and is being seen today for COVID-19. Symptoms starting Tuesday with fatigue and headache. Then began to note chills and congestion. Yesterday with fever > 100. Took a home COVID test last night which was positive. Notes continued head congestion making it harder to wear his CPAP at night. Is eating and hydrating well. Denies chest pain, SOB or GI symptoms.   OTC -- Tylenol yesterday. This morning took Naproxen for headache.    HPI: HPI  Problems:  Patient Active Problem List   Diagnosis Date Noted   Hypogonadism in male 01/24/2022   IFG (impaired fasting glucose) 01/24/2022   Hyperlipidemia 01/24/2022   History of diverticulitis 01/24/2022   Hypertension 01/24/2022   Bilateral carpal tunnel syndrome 11/10/2021   Pain in right hand 10/14/2021   Type 2 diabetes mellitus without complication, without long-term current use of insulin ([place]) 02/20/2020   Obstructive sleep apnea 02/20/2020    Allergies:  Allergies  Allergen Reactions   Niacin Swelling   Niacin And Related Other (See Comments)    Passing out   Medications:  Current Outpatient Medications:    atorvastatin (LIPITOR) 40 MG tablet, Take 40 mg by mouth daily., Disp: ,  Rfl:    cetirizine (ZYRTEC ALLERGY) 10 MG tablet, Take 1 tablet (10 mg total) by mouth at bedtime., Disp: 90 tablet, Rfl: 1   escitalopram (LEXAPRO) 5 MG tablet, , Disp: , Rfl:    fluticasone (FLONASE) 50 MCG/ACT nasal spray, Place 1 spray into both nostrils daily., Disp: 47.4 mL, Rfl: 1   losartan (COZAAR) 50 MG tablet, Take 50 mg by mouth daily., Disp: , Rfl:    metFORMIN (GLUCOPHAGE) 500 MG  tablet, , Disp: , Rfl:    Testosterone 1.62 % GEL, 2 pumps to skin in the morning to shoulder, upper arms or abdomen, Disp: 75 g, Rfl: 5   topiramate (TOPAMAX) 25 MG tablet, , Disp: , Rfl:   Observations/Objective: Patient is well-developed, well-nourished in no acute distress.  Resting comfortably at home.  Head is normocephalic, atraumatic.  No labored breathing. Speech is clear and coherent with logical content.  Patient is alert and oriented at baseline.   Assessment and Plan: 1. COVID-19  Patient with multiple risk factors for complicated course of illness. Discussed risks/benefits of antiviral medications including most common potential ADRs. Patient voiced understanding and would like to proceed with antiviral medication. They are candidate for Paxlovid as recent normal renal function (GFR > 99). Rx sent to pharmacy. Supportive measures, OTC medications and vitamin regimen reviewed. Quarantine reviewed in detail. Strict ER precautions discussed with patient.   Follow Up Instructions: I discussed the assessment and treatment plan with the patient. The patient was provided an opportunity to ask questions and all were answered. The patient agreed with the plan and demonstrated an understanding of the instructions.  A copy of instructions were sent to the patient via MyChart unless otherwise noted below.   The patient was advised to call back or seek an in-person evaluation if the symptoms worsen or if the condition fails to improve as anticipated.  Time:  I spent 10 minutes with the patient via telehealth technology discussing the above problems/concerns.    [name], PA-C

## 2022-02-24 ENCOUNTER — Other Ambulatory Visit (HOSPITAL_COMMUNITY): Payer: 59

## 2022-03-09 ENCOUNTER — Telehealth (HOSPITAL_COMMUNITY): Payer: Self-pay | Admitting: *Deleted

## 2022-03-09 NOTE — Telephone Encounter (Signed)
Pt reached and given instructions for ETT scheduled on 03/14/22.

## 2022-03-14 ENCOUNTER — Ambulatory Visit (HOSPITAL_BASED_OUTPATIENT_CLINIC_OR_DEPARTMENT_OTHER): Payer: 59

## 2022-03-14 ENCOUNTER — Ambulatory Visit (HOSPITAL_COMMUNITY): Payer: 59 | Attending: Cardiology

## 2022-03-14 DIAGNOSIS — R000 Tachycardia, unspecified: Secondary | ICD-10-CM | POA: Diagnosis present

## 2022-03-14 DIAGNOSIS — R5383 Other fatigue: Secondary | ICD-10-CM | POA: Insufficient documentation

## 2022-03-14 DIAGNOSIS — R7301 Impaired fasting glucose: Secondary | ICD-10-CM | POA: Diagnosis present

## 2022-03-14 LAB — EXERCISE TOLERANCE TEST
Angina Index: 0
Duke Treadmill Score: 8
Estimated workload: 10.1
Exercise duration (min): 8 min
Exercise duration (sec): 8 s
MPHR: 172 {beats}/min
Peak HR: 164 {beats}/min
Percent HR: 95 %
Rest HR: 70 {beats}/min
ST Depression (mm): 0 mm

## 2022-03-14 LAB — ECHOCARDIOGRAM COMPLETE
Area-P 1/2: 3.65 cm2
S' Lateral: 2.7 cm
Weight: 3424 oz

## 2022-03-15 NOTE — Progress Notes (Signed)
Jordan Mclean, pumping function of the heart looks good at 60 to 65% that is normal.  Walls are moving normally which indicates no old injury.  The valves look like they are doing well and opening closing properly.  Everything looks great.  No concerning findings.

## 2022-03-15 NOTE — Progress Notes (Signed)
Jordan Mclean, it looks like this couple of the leads came off when you were working your hardest on the treadmill.  But otherwise it was a negative study they did not see any concerning findings.  That is very reassuring.  We probably do need to check your A1c to screen for diabetes and prediabetes again it has been a while.

## 2022-03-25 ENCOUNTER — Encounter: Payer: Self-pay | Admitting: Family Medicine

## 2022-03-25 ENCOUNTER — Ambulatory Visit (INDEPENDENT_AMBULATORY_CARE_PROVIDER_SITE_OTHER): Payer: 59 | Admitting: Family Medicine

## 2022-03-25 VITALS — BP 120/61 | HR 88 | Wt 219.0 lb

## 2022-03-25 DIAGNOSIS — R5383 Other fatigue: Secondary | ICD-10-CM

## 2022-03-25 DIAGNOSIS — R000 Tachycardia, unspecified: Secondary | ICD-10-CM

## 2022-03-25 DIAGNOSIS — I10 Essential (primary) hypertension: Secondary | ICD-10-CM

## 2022-03-25 DIAGNOSIS — E291 Testicular hypofunction: Secondary | ICD-10-CM | POA: Diagnosis not present

## 2022-03-25 DIAGNOSIS — Z7689 Persons encountering health services in other specified circumstances: Secondary | ICD-10-CM

## 2022-03-25 DIAGNOSIS — R7301 Impaired fasting glucose: Secondary | ICD-10-CM | POA: Diagnosis not present

## 2022-03-25 LAB — POCT GLYCOSYLATED HEMOGLOBIN (HGB A1C): Hemoglobin A1C: 5.7 % — AB (ref 4.0–5.6)

## 2022-03-25 MED ORDER — TOPIRAMATE 25 MG PO TABS: ORAL_TABLET | ORAL | 1 refills | Status: DC

## 2022-03-25 MED ORDER — TESTOSTERONE 1.62 % TD GEL: TRANSDERMAL | 5 refills | Status: DC

## 2022-03-25 MED ORDER — METFORMIN HCL 500 MG PO TABS: 500.0000 mg | ORAL_TABLET | Freq: Two times a day (BID) | ORAL | 3 refills | Status: DC

## 2022-03-25 NOTE — Assessment & Plan Note (Signed)
A1c looks great today at 5.7 in the prediabetes range he is on metformin 500 mg twice a day.

## 2022-03-25 NOTE — Progress Notes (Signed)
Established Patient Office Visit  Subjective   Patient ID: Jordan Mclean, male    DOB: 1975-06-10  Age: 47 y.o. MRN: [id number]  Chief Complaint  Patient presents with   ifg    HPI  Impaired fasting glucose-no increased thirst or urination. No symptoms consistent with hypoglycemia.  Metformin 500 mg twice a day.  Follow-up fatigue and Tachycardia he is doing okay still feeling like he is having a really hard time getting motivated to exercise and also the fatigue makes it more difficult to really get started as well.  He is actually up 5 pounds from when I last saw him about 2 weeks ago.  He had spent a lot of money on a weight loss program at [facility] and had lost quite a bit of weight and has slowly been gaining it back.  He did Optifast.  He would like to have a refill on the Topamax.  He was on that for appetite suppression as well but ran out a while back.  He tolerated it well but did get some tingling in his fingertips.  Hypogonadism-started topical testosterone.  ROS    Objective:     BP 120/61   Pulse 88   Wt 219 lb (99.3 kg)   SpO2 96%   BMI 33.30 kg/m    Physical Exam   Results for orders placed or performed in visit on 03/25/22  POCT glycosylated hemoglobin (Hb A1C)  Result Value Ref Range   Hemoglobin A1C 5.7 (A) 4.0 - 5.6 %   HbA1c POC (<> result, manual entry)     HbA1c, POC (prediabetic range)     HbA1c, POC (controlled diabetic range)        The 10-year ASCVD risk score (Arnett DK, et al., 2019) is: 4.8%* (Cholesterol units were assumed)    Assessment & Plan:   Problem List Items Addressed This Visit       Cardiovascular and Mediastinum   Hypertension    Blood pressure looks absolutely fantastic today.  He says at home it has been running more in the 130s but it looks great today.        Endocrine   IFG (impaired fasting glucose)    A1c looks great today at 5.7 in the prediabetes range he is on metformin 500 mg twice a day.       Relevant Medications   metFORMIN (GLUCOPHAGE) 500 MG tablet   Other Relevant Orders   POCT glycosylated hemoglobin (Hb A1C) (Completed)   Hypogonadism in male    Back on daily topical testosterone doing 2 pumps in the morning.  Did refill medication today and encouraged him to come back in the next week or 2 in the morning about a couple hours after application so that we can get his labs checked.      Relevant Medications   Testosterone 1.62 % GEL   Other Relevant Orders   Testosterone Total,Free,Bio, Males   CBC   PSA     Other   Encounter for weight management    We discussed options including referral to healthy weight and wellness which I think would be a great option for him.  Also just really setting some small goals in regards to exercise he knows that he does better when he is exercising regularly.  He feels like it really gets him motivated to eat more healthy as well.  Now that he had a normal stress test he is okay to start exercising again.  We also  discussed some of the newer GLP-1's.  It sounds like they try to get authorization in the past with his prediabetes and when they were unable to get it approved.  I will go ahead and restart the Topamax.      Other Visit Diagnoses     Other fatigue    -  Primary   Tachycardia           Return in about 6 months (around 09/25/2022) for Prediabetes and testosterone .    Beatrice Lecher, MD

## 2022-03-25 NOTE — Assessment & Plan Note (Signed)
Back on daily topical testosterone doing 2 pumps in the morning.  Did refill medication today and encouraged him to come back in the next week or 2 in the morning about a couple hours after application so that we can get his labs checked.

## 2022-03-25 NOTE — Assessment & Plan Note (Signed)
We discussed options including referral to healthy weight and wellness which I think would be a great option for him.  Also just really setting some small goals in regards to exercise he knows that he does better when he is exercising regularly.  He feels like it really gets him motivated to eat more healthy as well.  Now that he had a normal stress test he is okay to start exercising again.  We also discussed some of the newer GLP-1's.  It sounds like they try to get authorization in the past with his prediabetes and when they were unable to get it approved.  I will go ahead and restart the Topamax.

## 2022-03-25 NOTE — Assessment & Plan Note (Signed)
Blood pressure looks absolutely fantastic today.  He says at home it has been running more in the 130s but it looks great today.

## 2022-05-03 ENCOUNTER — Encounter: Payer: Self-pay | Admitting: Family Medicine

## 2022-05-04 NOTE — Telephone Encounter (Signed)
Appointment scheduled for 05/10/22 at 3pm=kph

## 2022-05-04 NOTE — Telephone Encounter (Signed)
Let's schedule with Dr. Karie Schwalbe this week or early next if possible

## 2022-05-04 NOTE — Telephone Encounter (Signed)
Attempted call to patient. Left voice mail requesting a return call.  

## 2022-05-10 ENCOUNTER — Other Ambulatory Visit (INDEPENDENT_AMBULATORY_CARE_PROVIDER_SITE_OTHER): Payer: 59

## 2022-05-10 ENCOUNTER — Ambulatory Visit (INDEPENDENT_AMBULATORY_CARE_PROVIDER_SITE_OTHER): Payer: 59 | Admitting: Sports Medicine

## 2022-05-10 DIAGNOSIS — M722 Plantar fascial fibromatosis: Secondary | ICD-10-CM | POA: Diagnosis not present

## 2022-05-10 MED ORDER — MELOXICAM 15 MG PO TABS: ORAL_TABLET | ORAL | 3 refills | Status: DC

## 2022-05-10 NOTE — Assessment & Plan Note (Signed)
Months of pain right plantar calcaneus, worse in the morning, has tried some stretches without efficacy, hoping for an injection today. We did a plantar fascial injection, he will do some home PT, meloxicam, avoidance of barefoot walking, return to see me in 6 weeks.

## 2022-05-10 NOTE — Progress Notes (Signed)
    Procedures performed today:    Procedure: Real-time Ultrasound Guided injection of the right plantar fascial origin Device: Samsung HS60  Verbal informed consent obtained.  Time-out conducted.  Noted no overlying erythema, induration, or other signs of local infection.  Skin prepped in a sterile fashion.  Local anesthesia: Topical Ethyl chloride.  With sterile technique and under real time ultrasound guidance: Noted thickened plantar fascia, 1 cc Kenalog 40, 1 cc lidocaine, 1 cc bupivacaine injected easily Completed without difficulty  Advised to call if fevers/chills, erythema, induration, drainage, or persistent bleeding.  Images permanently stored and available for review in PACS.  Impression: Technically successful ultrasound guided injection.  Independent interpretation of notes and tests performed by another provider:   None.  Brief History, Exam, Impression, and Recommendations:    Plantar fasciitis, right Months of pain right plantar calcaneus, worse in the morning, has tried some stretches without efficacy, hoping for an injection today. We did a plantar fascial injection, he will do some home PT, meloxicam, avoidance of barefoot walking, return to see me in 6 weeks.    ____________________________________________ Ihor Austin. Benjamin Stain, M.D., ABFM., CAQSM., AME. Primary Care and Sports Medicine [facility]  Adjunct Professor of Family Medicine  [place] [employer]  Restaurant manager, fast food

## 2022-05-30 ENCOUNTER — Encounter (INDEPENDENT_AMBULATORY_CARE_PROVIDER_SITE_OTHER): Payer: 59 | Admitting: Sports Medicine

## 2022-05-30 ENCOUNTER — Encounter: Payer: Self-pay | Admitting: Family Medicine

## 2022-05-30 DIAGNOSIS — E291 Testicular hypofunction: Secondary | ICD-10-CM

## 2022-05-30 DIAGNOSIS — M722 Plantar fascial fibromatosis: Secondary | ICD-10-CM | POA: Diagnosis not present

## 2022-05-30 DIAGNOSIS — R7301 Impaired fasting glucose: Secondary | ICD-10-CM

## 2022-05-30 NOTE — Telephone Encounter (Signed)
Would need an appointment to discuss starting a new medication but I would recommend that he check with his insurance first on coverage.  Please let him know that there is refills already they are and he will need to speak to the pharmacy if he would like to be set on auto refill.  That has to do with the pharmacies computer system and not ours.

## 2022-05-31 MED ORDER — TRAMADOL HCL 50 MG PO TABS: 50.0000 mg | ORAL_TABLET | Freq: Three times a day (TID) | ORAL | 0 refills | Status: DC | PRN

## 2022-05-31 NOTE — Telephone Encounter (Signed)
I spent 5 total minutes of online digital evaluation and management services in this patient-initiated request for online care. 

## 2022-06-02 ENCOUNTER — Encounter: Payer: Self-pay | Admitting: Family Medicine

## 2022-06-02 ENCOUNTER — Telehealth (INDEPENDENT_AMBULATORY_CARE_PROVIDER_SITE_OTHER): Payer: 59 | Admitting: Family Medicine

## 2022-06-02 VITALS — Ht 68.0 in | Wt 212.0 lb

## 2022-06-02 DIAGNOSIS — R7301 Impaired fasting glucose: Secondary | ICD-10-CM | POA: Diagnosis not present

## 2022-06-02 DIAGNOSIS — Z7689 Persons encountering health services in other specified circumstances: Secondary | ICD-10-CM

## 2022-06-02 DIAGNOSIS — I10 Essential (primary) hypertension: Secondary | ICD-10-CM

## 2022-06-02 MED ORDER — ZEPBOUND 2.5 MG/0.5ML ~~LOC~~ SOAJ: 2.5000 mg | SUBCUTANEOUS | 0 refills | Status: DC

## 2022-06-02 NOTE — Progress Notes (Signed)
Pt stated that he is not taking any BP medications.  He would like to discuss getting on Zepbound. He stated that this is what his wife is taking and he feels that he would be a good candidate for one of the newer diabetic medications that would also help with weight loss.

## 2022-06-02 NOTE — Assessment & Plan Note (Signed)
Discussed options. He has BMI > 30 with HTN, OSA, Prediabetes plantar fascitis and Hypogonadism secondary to obesity. He owule be a great candidate for a GLP1.   Visit #: 1 Starting Weight: 220 lbs  Current weight:220 lbs  Previous weight: Change in weight: Goal weight: Dietary goals: cut back on portion, increase veggies Exercise goals: walking  Medication: Zepbound 2.5mg   Follow-up and referrals: 6-8 weeks.

## 2022-06-02 NOTE — Progress Notes (Signed)
   Acute Office Visit  Subjective:     Patient ID: Jordan Mclean, male    DOB: 21-Jul-1975, 47 y.o.   MRN: [id number]  Chief Complaint  Patient presents with   Weight Gain    HPI Patient is in today for Pt stated that he is not taking any BP medications.   He would like to discuss getting on Zepbound. He stated that this is what his wife is taking and he feels that he would be a good candidate for one of the newer diabetic medications that would also help with weight loss. Also dealing with Plantar fascitis and it has made if difficult for him to exercise regularly.  As ever since his surgery last summer and rounds of prednisone he is just gained weight and really struggled.  He is tried cutting back on portions but the scale does not seem to move.  No prior history of pancreatitis.  Only on metformin and topiramate.  ROS      Objective:    Ht 5\' 8"  (1.727 m)   Wt 212 lb (96.2 kg)   BMI 32.23 kg/m    Physical Exam Constitutional:      Appearance: Normal appearance.  Neurological:     Mental Status: He is alert.  Psychiatric:        Mood and Affect: Mood normal.     No results found for any visits on 06/02/22.      Assessment & Plan:   Problem List Items Addressed This Visit       Cardiovascular and Mediastinum   Hypertension     Endocrine   IFG (impaired fasting glucose) - Primary     Other   Encounter for weight management    Discussed options. He has BMI > 30 with HTN, OSA, Prediabetes plantar fascitis and Hypogonadism secondary to obesity. He owule be a great candidate for a GLP1.   Visit #: 1 Starting Weight: 220 lbs  Current weight:220 lbs  Previous weight: Change in weight: Goal weight: Dietary goals: cut back on portion, increase veggies Exercise goals: walking  Medication: Zepbound 2.5mg   Follow-up and referrals: 6-8 weeks.        Relevant Medications   tirzepatide (ZEPBOUND) 2.5 MG/0.5ML Pen    Meds ordered this encounter   Medications   tirzepatide (ZEPBOUND) 2.5 MG/0.5ML Pen    Sig: Inject 2.5 mg into the skin once a week.    Dispense:  2 mL    Refill:  0    No follow-ups on file.  I spent 25 minutes on the day of the encounter to include pre-visit record review, face-to-face time with the patient and post visit ordering of test.   Nani Gasser, MD

## 2022-06-07 ENCOUNTER — Encounter: Payer: Self-pay | Admitting: Family Medicine

## 2022-06-14 ENCOUNTER — Encounter: Payer: Self-pay | Admitting: Family Medicine

## 2022-06-14 DIAGNOSIS — R7301 Impaired fasting glucose: Secondary | ICD-10-CM

## 2022-06-16 ENCOUNTER — Telehealth: Payer: Self-pay

## 2022-06-16 NOTE — Telephone Encounter (Addendum)
Initiated Prior authorization [id number] 2.5MG /0.5ML pen-injectors  Via: Covermymeds Case/Key:[id number]  Status: denied as of 06/16/22 Reason:The requested medication and/or diagnosis are not a covered benefit and are excluded from coverage in accordance with the terms and conditions of your plan benefit. Therefore, this request has been administratively denied.  Notified Pt via: Mychart

## 2022-06-17 NOTE — Telephone Encounter (Signed)
Tell to call insurance to see what is covered.

## 2022-06-21 ENCOUNTER — Ambulatory Visit (INDEPENDENT_AMBULATORY_CARE_PROVIDER_SITE_OTHER): Payer: 59 | Admitting: Sports Medicine

## 2022-06-21 ENCOUNTER — Ambulatory Visit (INDEPENDENT_AMBULATORY_CARE_PROVIDER_SITE_OTHER): Payer: 59

## 2022-06-21 DIAGNOSIS — M722 Plantar fascial fibromatosis: Secondary | ICD-10-CM

## 2022-06-21 MED ORDER — METFORMIN HCL 1000 MG PO TABS: 1000.0000 mg | ORAL_TABLET | Freq: Two times a day (BID) | ORAL | 0 refills | Status: DC

## 2022-06-21 MED ORDER — TOPIRAMATE 50 MG PO TABS: 50.0000 mg | ORAL_TABLET | Freq: Two times a day (BID) | ORAL | 0 refills | Status: DC

## 2022-06-21 NOTE — Progress Notes (Signed)
    Procedures performed today:    None.  Independent interpretation of notes and tests performed by another provider:   None.  Brief History, Exam, Impression, and Recommendations:    Plantar fasciitis, right This is a very pleasant 47 year old male, he works in IT, he has had months of pain plantar calcaneus on the right worst in the morning, he has tried stretches without efficacy, we injected him on 23 April, he unfortunately did not get any relief. At this point he has failed greater than 6 weeks of conservative treatment, we will proceed with x-rays and MRI, boot immobilization, I will give him information on PRP. He will continue his foot intrinsic conditioning.    ____________________________________________ Ihor Austin. Benjamin Stain, M.D., ABFM., CAQSM., AME. Primary Care and Sports Medicine [facility]  Adjunct Professor of Family Medicine  [place] [employer] [facility] [facility]  Restaurant manager, fast food

## 2022-06-21 NOTE — Assessment & Plan Note (Signed)
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 At this point he has failed greater than 6 weeks of conservative treatment, we will proceed with x-rays and MRI, boot immobilization, I will give him information on PRP. He will continue his foot intrinsic conditioning.

## 2022-06-21 NOTE — Patient Instructions (Signed)
 Platelet-rich plasma is used in musculoskeletal medicine to focus your own body's ability to heal. It has several well-done published randomized control trials (RCT) which demonstrate both its effectiveness and safety in many musculoskeletal conditions, including osteoarthritis, tendinopathies, and damaged vertebral discs. PRP has been in clinical use since the 1990's. Many people know that platelets form a clot if there is a cut in the skin. It turns out that platelets do not only form a clot, they also start the body's own repair process. When platelets activate to form a clot, they also release alpha granules which have hundreds of chemical messengers in them that initiate and organize repair to the damaged tissue. Precisely placing PRP into the site of injury will initiate the healing process by activating on the damaged cartilage or tendon. This is an inflammatory process, and inflammation is the vital first phase of Healing.  What to expect and how to prepare for PRP   2 weeks prior to the procedure: depending on the procedure, you may need to arrange for a driver to bring you home. IF you are having a lower extremity procedure, we can provide crutches as needed.   7 days prior to the procedure: Stop taking anti-inflammatory drugs like ibuprofen, Naprosyn, Celebrex, or Meloxicam. Let Dr. Markiesha Delia know if you have been taking prednisone or other corticosteroids in the last month.   The day before the procedure: thoroughly shower and clean your skin.    The day of the procedure: Wear loose-fitting clothing like sweatpants or shorts. If you are having an upper body procedure wear a top that can button or zip up.  PRP will initiate healing and a productive inflammation, and PRP therapy will make the body part treated sore for 4 days to two weeks. Anti-inflammatory drugs (i.e. ibuprofen, Naprosyn, Celebrex) and corticosteroids such as prednisone can blunt or stop this process,  so it is important to not take any anti-inflammatory drugs for 7 days before getting PRP therapy, or for at least three weeks after PRP therapy. Corticosteroid injections can blunt inflammation for 30 days, so let us know if you have had one recently. Depending on the body part injected, you may be in a sling or on crutches for several days. Just like wringing out a wet dishcloth, if you load or tense a tendon or ligament that has just been injected with PRP, some of the PRP injected will squish out. By keeping the body part treated relaxed by using a sling (for the shoulder or arm) or crutches (for hips and legs) for a few days, the PRP can bind in place and do its job.   You may need a driver to bring you home.  Tobacco/nicotine is a potent toxin and its use constricts small blood vessels which are needed for tissue repair.  Tobacco/nicotine use will limit the effectiveness of any treatment and stopping tobacco use is one of the single  greatest actions you can take to improve your health. Avoid toxins like alcohol, which inhibits and depresses the cells needed for tissue repair.  What happens during the PRP procedure?  Platelet rich plasma is made by taking some of your blood and performing a two-stage centrifuge process on it to concentrate the PRP. First, your blood is drawn into a syringe with a small amount of anti-coagulant in it (this is to keep the blood from clotting during this process). The amount of blood drawn is usually about 10-30 milliliters, depending on how much PRP is needed for   the treatment.  (There are 355 milliliters in a 12-ounce soda can for comparison).  Then the blood is transferred in a sterile fashion into a centrifuge tube. It is then centrifuged for the first cycle where the red blood cells are isolated and discarded. In the second centrifuge cycle, the platelet-rich fraction of the remaining plasma is concentrated and placed in a syringe. The skin at the  injection site is numbed with a small amount of topical cooling spray. Dr Luceil Herrin will then precisely inject the PRP into the injury site using ultrasound guidance.  What to do after your procedure  I will give you specific medicine to control any discomfort you may have after the procedure. Avoid NSAIDs like ibuprofen. Acetaminophen can be used for mild pain.  Depending on the part of the body treated, usually you will be placed in a sling or on crutches for 1 to 3 days. Do your best not to tense or load the treated area during this time. After 3 days, unless otherwise instructed, the treated body part should be used and slowly moved through its full range of motion. It will be sore, but you will not be doing damage by moving it, in fact it needs to move to heal. If you were on crutches for a period of time, walking is ok once you are off the crutches. For now, avoid activities that specifically hurt you before being treated. Exercise is vital to good health and finding a way to cross train around your injury is important not only for your physical health, but for your mental health as well. Ask me about cross training options for your injury. Some brief (10 minutes or less) period of heat or ice therapy will not hurt the therapy, but it is not required. Usually, depending on the initial injury, physical therapy is started from two weeks to four weeks after injection. Improvements in pain and function should be expected from 8 weeks to 12 weeks after injection and some injuries may require more than one treatment.    ____________________________________________ Jordan Mclean Jordan Mclean, M.D., ABFM., CAQSM., AME. Primary Care and Sports Medicine [facility]  Adjunct Professor of Family Medicine  [employer]  [employer]er  

## 2022-06-21 NOTE — Addendum Note (Signed)
Addended by: Nani Gasser D on: 06/21/2022 12:15 PM   Modules accepted: Orders

## 2022-06-27 ENCOUNTER — Ambulatory Visit (INDEPENDENT_AMBULATORY_CARE_PROVIDER_SITE_OTHER): Payer: 59 | Admitting: Family Medicine

## 2022-06-27 ENCOUNTER — Encounter: Payer: Self-pay | Admitting: Family Medicine

## 2022-06-27 ENCOUNTER — Encounter: Payer: Self-pay | Admitting: Sports Medicine

## 2022-06-27 DIAGNOSIS — R42 Dizziness and giddiness: Secondary | ICD-10-CM | POA: Insufficient documentation

## 2022-06-27 DIAGNOSIS — G43009 Migraine without aura, not intractable, without status migrainosus: Secondary | ICD-10-CM

## 2022-06-27 DIAGNOSIS — T50905A Adverse effect of unspecified drugs, medicaments and biological substances, initial encounter: Secondary | ICD-10-CM | POA: Insufficient documentation

## 2022-06-27 LAB — CBC
HCT: 46 % (ref 38.5–50.0)
WBC: 5.6 10*3/uL (ref 3.8–10.8)

## 2022-06-27 MED ORDER — SUMATRIPTAN SUCCINATE 50 MG PO TABS
50.0000 mg | ORAL_TABLET | ORAL | 0 refills | Status: DC | PRN
Start: 2022-06-27 — End: 2023-12-13

## 2022-06-27 NOTE — Progress Notes (Signed)
Established patient visit   Patient: Jordan Mclean   DOB: 28-Jun-1975   47 y.o. Male  MRN: [id number] Visit Date: 06/27/2022  Today's healthcare provider: Charlton Amor, DO   Chief Complaint  Patient presents with   Medication Problem    SUBJECTIVE    Chief Complaint  Patient presents with   Medication Problem   HPI   Pt presents today for acute visit. He doubled his metformin and 4 days ago became very lightheaded, nauseous, and fatigued. He also had his topiramate increased by weight management. He notes continued headaches that are sensitive to light and sound. He has a hx of migraines in the past but it not on any abortive therapy. He tried advil and tylenol and both did not help his headache. During the episode 4 days ago he was loading things in a heavy trailer when he became lightheaded. He denies any LOC. Does admit to dehydration at the time.   Review of Systems  Constitutional:  Negative for activity change, fatigue and fever.  Respiratory:  Negative for cough and shortness of breath.   Cardiovascular:  Negative for chest pain.  Gastrointestinal:  Negative for abdominal pain.  Genitourinary:  Negative for difficulty urinating.       Current Meds  Medication Sig   atorvastatin (LIPITOR) 40 MG tablet Take 40 mg by mouth daily.   fluticasone (FLONASE) 50 MCG/ACT nasal spray Place 1 spray into both nostrils daily.   meloxicam (MOBIC) 15 MG tablet One tab PO every 24 hours with a meal for 2 weeks, then once every 24 hours prn pain.   metFORMIN (GLUCOPHAGE) 1000 MG tablet Take 1 tablet (1,000 mg total) by mouth 2 (two) times daily with a meal.   SUMAtriptan (IMITREX) 50 MG tablet Take 1 tablet (50 mg total) by mouth every 2 (two) hours as needed for migraine. May repeat in 2 hours if headache persists or recurs.   Testosterone 1.62 % GEL 2 pumps to skin in the morning to shoulder, upper arms or abdomen   tirzepatide (ZEPBOUND) 2.5 MG/0.5ML Pen Inject 2.5 mg into the  skin once a week.   topiramate (TOPAMAX) 50 MG tablet Take 1 tablet (50 mg total) by mouth 2 (two) times daily. 1 tab PO QD x 1 week, then increase to 1 tab BID>   traMADol (ULTRAM) 50 MG tablet Take 1 tablet (50 mg total) by mouth every 8 (eight) hours as needed for moderate pain.    OBJECTIVE    BP 137/85 (BP Location: Left Arm, Patient Position: Sitting, Cuff Size: Normal)   Pulse (!) 105   Temp 98.4 F (36.9 C) (Oral)   Ht 5\' 8"  (1.727 m)   Wt 215 lb 0.6 oz (97.5 kg)   SpO2 97%   BMI 32.70 kg/m   Physical Exam Vitals and nursing note reviewed.  Constitutional:      General: He is not in acute distress.    Appearance: Normal appearance.  HENT:     Head: Normocephalic and atraumatic.     Right Ear: External ear normal.     Left Ear: External ear normal.     Nose: Nose normal.  Eyes:     Conjunctiva/sclera: Conjunctivae normal.  Cardiovascular:     Rate and Rhythm: Normal rate and regular rhythm.  Pulmonary:     Effort: Pulmonary effort is normal.     Breath sounds: Normal breath sounds.  Neurological:     General: No focal deficit present.  Mental Status: He is alert and oriented to person, place, and time.  Psychiatric:        Mood and Affect: Mood normal.        Behavior: Behavior normal.        Thought Content: Thought content normal.        Judgment: Judgment normal.       ASSESSMENT & PLAN    Problem List Items Addressed This Visit       Cardiovascular and Mediastinum   Migraine without aura and without status migrainosus, not intractable    - pt has hx of migraines and has not been on medication for years  - describes pain as unilateral and sensitive to light and sound. - will do a trial of imitrex and described how to take medication      Relevant Medications   SUMAtriptan (IMITREX) 50 MG tablet     Other   Lightheadedness    - pt describes episode of lightheadedness with concern for dehydration - will get CMP and CBC to further eval cause.  Ordered CMP to evaluate for kidney function and electrolyte status.  - possible causes: dehydration, medication side effect from metformin increase - recommend patient stop metformin and topiramate for one month until he can follow up with PCP      Relevant Orders   COMPLETE METABOLIC PANEL WITH GFR   CBC    No follow-ups on file.      Meds ordered this encounter  Medications   SUMAtriptan (IMITREX) 50 MG tablet    Sig: Take 1 tablet (50 mg total) by mouth every 2 (two) hours as needed for migraine. May repeat in 2 hours if headache persists or recurs.    Dispense:  30 tablet    Refill:  0    Orders Placed This Encounter  Procedures   COMPLETE METABOLIC PANEL WITH GFR   CBC     Charlton Amor, DO  [facility] Primary Care & Sports Medicine at [facility] [phone] (phone) [phone] (fax)  [facility]

## 2022-06-27 NOTE — Assessment & Plan Note (Signed)
-   pt describes episode of lightheadedness with concern for dehydration - will get CMP and CBC to further eval cause. Ordered CMP to evaluate for kidney function and electrolyte status.  - possible causes: dehydration, medication side effect from metformin increase - recommend patient stop metformin and topiramate for one month until he can follow up with PCP

## 2022-06-27 NOTE — Assessment & Plan Note (Signed)
-   pt has hx of migraines and has not been on medication for years  - describes pain as unilateral and sensitive to light and sound. - will do a trial of imitrex and described how to take medication

## 2022-06-28 ENCOUNTER — Other Ambulatory Visit: Payer: Self-pay | Admitting: Family Medicine

## 2022-06-28 ENCOUNTER — Encounter: Payer: Self-pay | Admitting: Family Medicine

## 2022-06-28 DIAGNOSIS — E6609 Other obesity due to excess calories: Secondary | ICD-10-CM | POA: Insufficient documentation

## 2022-06-28 DIAGNOSIS — G4733 Obstructive sleep apnea (adult) (pediatric): Secondary | ICD-10-CM

## 2022-06-28 LAB — COMPLETE METABOLIC PANEL WITH GFR
AG Ratio: 1.8 (calc) (ref 1.0–2.5)
ALT: 53 U/L — ABNORMAL HIGH (ref 9–46)
AST: 27 U/L (ref 10–40)
Albumin: 4.3 g/dL (ref 3.6–5.1)
Alkaline phosphatase (APISO): 53 U/L (ref 36–130)
BUN: 15 mg/dL (ref 7–25)
CO2: 23 mmol/L (ref 20–32)
Calcium: 9.3 mg/dL (ref 8.6–10.3)
Chloride: 110 mmol/L (ref 98–110)
Creat: 0.94 mg/dL (ref 0.60–1.29)
Globulin: 2.4 g/dL (calc) (ref 1.9–3.7)
Glucose, Bld: 137 mg/dL — ABNORMAL HIGH (ref 65–99)
Potassium: 4 mmol/L (ref 3.5–5.3)
Sodium: 143 mmol/L (ref 135–146)
Total Bilirubin: 0.6 mg/dL (ref 0.2–1.2)
Total Protein: 6.7 g/dL (ref 6.1–8.1)
eGFR: 99 mL/min/{1.73_m2} (ref >=60)

## 2022-06-28 LAB — CBC
Hemoglobin: 15.7 g/dL (ref 13.2–17.1)
MCH: 30.7 pg (ref 27.0–33.0)
MCHC: 34.1 g/dL (ref 32.0–36.0)
MCV: 90 fL (ref 80.0–100.0)
MPV: 8.1 fL (ref 7.5–12.5)
Platelets: 242 10*3/uL (ref 140–400)
RBC: 5.11 10*6/uL (ref 4.20–5.80)
RDW: 12.5 % (ref 11.0–15.0)

## 2022-06-28 MED ORDER — OZEMPIC (0.25 OR 0.5 MG/DOSE) 2 MG/3ML ~~LOC~~ SOPN
0.2500 mg | PEN_INJECTOR | SUBCUTANEOUS | 0 refills | Status: DC
Start: 2022-06-28 — End: 2022-07-13

## 2022-07-12 ENCOUNTER — Telehealth: Payer: Self-pay

## 2022-07-12 NOTE — Telephone Encounter (Addendum)
Initiated Prior authorization [id number] (0.25 or 0.5 MG/DOSE) 2MG /3ML pen-injectors Via: Covermymeds Case/Key:[id number] Status: approved as of 07/12/22 Reason:Authorization Expiration Date: July 12, 2023. Notified Pt via: Mychart

## 2022-07-13 ENCOUNTER — Encounter: Payer: Self-pay | Admitting: Family Medicine

## 2022-07-13 ENCOUNTER — Encounter: Payer: Self-pay | Admitting: Sports Medicine

## 2022-07-13 DIAGNOSIS — G4733 Obstructive sleep apnea (adult) (pediatric): Secondary | ICD-10-CM

## 2022-07-13 DIAGNOSIS — E6609 Other obesity due to excess calories: Secondary | ICD-10-CM

## 2022-07-13 MED ORDER — OZEMPIC (0.25 OR 0.5 MG/DOSE) 2 MG/3ML ~~LOC~~ SOPN: 0.5000 mg | PEN_INJECTOR | SUBCUTANEOUS | 0 refills | Status: DC

## 2022-07-13 NOTE — Telephone Encounter (Signed)
OK to increase to 0.5 mg for the next month.  New prescription sent to [facility].  Encouraged him to follow-up with me in about 4 weeks.

## 2022-07-19 ENCOUNTER — Ambulatory Visit (INDEPENDENT_AMBULATORY_CARE_PROVIDER_SITE_OTHER): Payer: 59 | Admitting: Sports Medicine

## 2022-07-19 ENCOUNTER — Ambulatory Visit: Payer: 59

## 2022-07-19 DIAGNOSIS — M722 Plantar fascial fibromatosis: Secondary | ICD-10-CM

## 2022-07-19 MED ORDER — GABAPENTIN 300 MG PO CAPS
ORAL_CAPSULE | ORAL | 3 refills | Status: DC
Start: 2022-07-19 — End: 2022-08-11

## 2022-07-19 NOTE — Assessment & Plan Note (Signed)
This is a very pleasant 47 year old male, he works in IT, he had months of pain plantar heel, on the right worse in the morning, he has tried some stretches without efficacy, we injected him in April, he did not get any relief, we have ordered an MRI, he will get this MRI done today, he has been in boot immobilization now for a month, we did discuss PRP. If MRI does confirm plantar fasciitis I would certainly like to get a surgical consultation with Dr. Ralene Cork, before we consider PRP percutaneous tenotomy. If everything is negative we do need to consider the possibility that this could be radicular. I am going to add some Neurontin for him to take in the meantime.

## 2022-07-19 NOTE — Progress Notes (Signed)
    Procedures performed today:    None.  Independent interpretation of notes and tests performed by another provider:   None.  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 If MRI does confirm plantar fasciitis I would certainly like to get a surgical consultation with Dr. Ralene Cork, before we consider PRP percutaneous tenotomy. If everything is negative we do need to consider the possibility that this could be radicular. I am going to add some Neurontin for him to take in the meantime.    ____________________________________________ Ihor Austin. Benjamin Stain, M.D., ABFM., CAQSM., AME. Primary Care and Sports Medicine [facility]  Adjunct Professor of Family Medicine  [employer]  Restaurant manager, fast food

## 2022-07-23 ENCOUNTER — Other Ambulatory Visit: Payer: Self-pay | Admitting: Family Medicine

## 2022-07-23 DIAGNOSIS — E6609 Other obesity due to excess calories: Secondary | ICD-10-CM

## 2022-07-23 DIAGNOSIS — G4733 Obstructive sleep apnea (adult) (pediatric): Secondary | ICD-10-CM

## 2022-07-26 ENCOUNTER — Encounter: Payer: Self-pay | Admitting: Family Medicine

## 2022-07-26 ENCOUNTER — Encounter: Payer: Self-pay | Admitting: Sports Medicine

## 2022-08-05 ENCOUNTER — Encounter: Payer: Self-pay | Admitting: Podiatry

## 2022-08-05 ENCOUNTER — Ambulatory Visit (INDEPENDENT_AMBULATORY_CARE_PROVIDER_SITE_OTHER): Payer: Self-pay | Admitting: Podiatry

## 2022-08-05 DIAGNOSIS — Z91199 Patient's noncompliance with other medical treatment and regimen due to unspecified reason: Secondary | ICD-10-CM

## 2022-08-05 NOTE — Progress Notes (Signed)
No show

## 2022-08-10 ENCOUNTER — Ambulatory Visit: Payer: Self-pay | Admitting: Podiatry

## 2022-08-10 ENCOUNTER — Encounter: Payer: Self-pay | Admitting: Family Medicine

## 2022-08-11 ENCOUNTER — Encounter: Payer: Self-pay | Admitting: Family Medicine

## 2022-08-11 ENCOUNTER — Telehealth (INDEPENDENT_AMBULATORY_CARE_PROVIDER_SITE_OTHER): Payer: 59 | Admitting: Family Medicine

## 2022-08-11 VITALS — BP 130/86 | Wt 209.0 lb

## 2022-08-11 DIAGNOSIS — Z713 Dietary counseling and surveillance: Secondary | ICD-10-CM | POA: Diagnosis not present

## 2022-08-11 DIAGNOSIS — Z7689 Persons encountering health services in other specified circumstances: Secondary | ICD-10-CM

## 2022-08-11 MED ORDER — SEMAGLUTIDE (1 MG/DOSE) 4 MG/3ML ~~LOC~~ SOPN: 1.0000 mg | PEN_INJECTOR | SUBCUTANEOUS | 1 refills | Status: DC

## 2022-08-11 NOTE — Assessment & Plan Note (Addendum)
Discussed options. He has BMI > 30 with HTN, OSA, Prediabetes plantar fascitis and Hypogonadism secondary to obesity. He owule be a great candidate for a GLP1.    Visit #: 2 Starting Weight: 220 lbs   Current weight:209 lbs  Previous weight: 215 lbs  Change in weight: down 6 lbs  Goal weight: Dietary goals: Discussed eating the protein and veggies first and saving the carbs for last.  Also slowing down to eat to see if he is starting to feel satisfied versus full. Exercise goals: Gust may be adding in some resistance training for 10 to 15 minutes twice a week. Medication: Will increase Ozempic to 1 mg.  But plan will be to try to switch back to Zepp bound once he gets his new insurance August 1.  Will give [place] an update as soon as he gets his card and information. Follow-up and referrals: 6-8 weeks.

## 2022-08-11 NOTE — Progress Notes (Signed)
Virtual Visit via Video Note  I connected with Jordan Mclean on 08/11/22 at  3:20 PM EDT by a video enabled telemedicine application and verified that I am speaking with the correct person using two identifiers.   I discussed the limitations of evaluation and management by telemedicine and the availability of in person appointments. The patient expressed understanding and agreed to proceed.  Patient location: at home Provider location: in office  Subjective:    CC:   Chief Complaint  Patient presents with   Weight Check    HPI: Follow-up for [place] weight management-overall he is doing well.  He just feels like he is sort of plateaued between 209 and 212 pounds he feels like it just keeps going up and down.  His start weight was 220 pounds.  He is up to 0.5 mg on the semaglutide.  He does get a little nausea the second day after the injection but then the rest of the week he actually feels like his appetite increases.  He has been trying to eat more healthy he and his wife have both been on this plan together.  He has not really been exercising regularly he says he is only exercised once since we last spoke.  He will be switching to a new insurance plan as of August 1.   Past medical history, Surgical history, Family history not pertinant except as noted below, Social history, Allergies, and medications have been entered into the medical record, reviewed, and corrections made.    Objective:    General: Speaking clearly in complete sentences without any shortness of breath.  Alert and oriented x3.  Normal judgment. No apparent acute distress.    Impression and Recommendations:    Problem List Items Addressed This Visit       Other   Encounter for weight management - Primary    Discussed options. He has BMI > 30 with HTN, OSA, Prediabetes plantar fascitis and Hypogonadism secondary to obesity. He owule be a great candidate for a GLP1.    Visit #: 2 Starting Weight: 220  lbs   Current weight:209 lbs  Previous weight: 215 lbs  Change in weight: down 6 lbs  Goal weight: Dietary goals: Discussed eating the protein and veggies first and saving the carbs for last.  Also slowing down to eat to see if he is starting to feel satisfied versus full. Exercise goals: Gust may be adding in some resistance training for 10 to 15 minutes twice a week. Medication: Will increase Ozempic to 1 mg.  But plan will be to try to switch back to Zepp bound once he gets his new insurance August 1.  Will give [place] an update as soon as he gets his card and information. Follow-up and referrals: 6-8 weeks.       No orders of the defined types were placed in this encounter.   Meds ordered this encounter  Medications   Semaglutide, 1 MG/DOSE, 4 MG/3ML SOPN    Sig: Inject 1 mg as directed once a week.    Dispense:  3 mL    Refill:  1     I discussed the assessment and treatment plan with the patient. The patient was provided an opportunity to ask questions and all were answered. The patient agreed with the plan and demonstrated an understanding of the instructions.   The patient was advised to call back or seek an in-person evaluation if the symptoms worsen or if the condition fails to  improve as anticipated.  I spent 25 minutes on the day of the encounter to include pre-visit record review, face-to-face time with the patient and post visit ordering of test.   Nani Gasser, MD

## 2022-08-11 NOTE — Progress Notes (Signed)
Pt reports that he feels nauseated in the mornings and hasn't noticed any weight loss.  He has been doing portion control. He hasn't been able to work out due to being in the boot from Plantar fasciitis.

## 2022-08-12 ENCOUNTER — Ambulatory Visit: Payer: Self-pay | Admitting: Podiatry

## 2022-08-12 NOTE — Progress Notes (Deleted)
  Subjective:  Patient ID: Jordan Mclean, male    DOB: 03-24-1975,   MRN: [id number]  No chief complaint on file.   47 y.o. male presents for concern of right foot plantar fasciitis that has been ongoing for months. He was referred here by Dr T for surgical consultation. He has been stretching, wearing support, had injections and still no relief. MRI was preformed as well and here to review . Denies any other pedal complaints. Denies n/v/f/c.   Past Medical History:  Diagnosis Date   Hyperlipemia    Hypertension     Objective:  Physical Exam: Vascular: DP/PT pulses 2/4 bilateral. CFT <3 seconds. Normal hair growth on digits. No edema.  Skin. No lacerations or abrasions bilateral feet.  Musculoskeletal: MMT 5/5 bilateral lower extremities in DF, PF, Inversion and Eversion. Deceased ROM in DF of ankle joint. Tender to the medial calcaneal tubercle on the right. No pain along arch of PT or achilles tendon. No pain with calcaneal squeeze. *** Neurological: Sensation intact to light touch.   MRI right foot:  IMPRESSION: 1. Severe plantar fasciitis of the medial band of the plantar fascia adjacent to the calcaneal insertion. 2. Mild tendinosis of the plantar lateral aspect of the peroneus longus with a short-segment longitudinal split tear and mild tenosynovitis.    Assessment:  No diagnosis found.   Plan:  Patient was evaluated and treated and all questions answered. Discussed plantar fasciitis with patient.  X-rays reviewed and discussed with patient. No acute fractures or dislocations noted. Mild spurring noted at inferior calcaneus.  Discussed treatment options including, ice, NSAIDS, supportive shoes, bracing, and stretching. Stretching exercises provided to be done on a daily basis.   Patient has exhausted conservative treatments and we did discuss surgical options in depth today and perioperative course.  Discussed plantar fasciotomy and post-op course.  -Informed surgical  risk consent was reviewed and read aloud to the patient.  I reviewed the films.  I have discussed my findings with the patient in great detail.  I have discussed all risks including but not limited to infection, stiffness, scarring, limp, disability, deformity, damage to blood vessels and nerves, numbness, poor healing, need for braces, arthritis, chronic pain, amputation, death.  All benefits and realistic expectations discussed in great detail.  I have made no promises as to the outcome.  I have provided realistic expectations.  I have offered the patient a 2nd opinion, which they have declined and assured me they preferred to proceed despite the risks. -Will plan for surgery ***

## 2022-08-19 ENCOUNTER — Ambulatory Visit (INDEPENDENT_AMBULATORY_CARE_PROVIDER_SITE_OTHER): Payer: 59 | Admitting: Podiatry

## 2022-08-19 ENCOUNTER — Encounter: Payer: Self-pay | Admitting: Podiatry

## 2022-08-19 ENCOUNTER — Ambulatory Visit: Payer: Self-pay | Admitting: Podiatry

## 2022-08-19 DIAGNOSIS — M722 Plantar fascial fibromatosis: Secondary | ICD-10-CM

## 2022-08-19 NOTE — Progress Notes (Signed)
Subjective:  Patient ID: Jordan Mclean, male    DOB: Aug 28, 1975,  MRN: [id number]  Chief Complaint  Patient presents with   Plantar Fasciitis    Pt stated that he has been struggling with plantar fasciitis for over a month     47 y.o. male presents with the above complaint.  Patient presents with right severe Planter fasciitis.  Patient was treated by previous physician who had undergone injection as well as cam boot immobilization however he has not had any improvement and has progressively gotten worse.  He has been struggling with plantar fasciitis for over a month now.  He wanted to discuss other treatment options more aggressive.  He denies any other acute complaints he has failed a lot of conservative care.   Review of Systems: Negative except as noted in the HPI. Denies N/V/F/Ch.  Past Medical History:  Diagnosis Date   Hyperlipemia    Hypertension     Current Outpatient Medications:    atorvastatin (LIPITOR) 40 MG tablet, Take 40 mg by mouth daily., Disp: , Rfl:    escitalopram (LEXAPRO) 5 MG tablet, Take 5 mg by mouth daily., Disp: , Rfl:    fluticasone (FLONASE) 50 MCG/ACT nasal spray, Place 1 spray into both nostrils daily., Disp: 47.4 mL, Rfl: 1   Semaglutide, 1 MG/DOSE, 4 MG/3ML SOPN, Inject 1 mg as directed once a week., Disp: 3 mL, Rfl: 1   SUMAtriptan (IMITREX) 50 MG tablet, Take 1 tablet (50 mg total) by mouth every 2 (two) hours as needed for migraine. May repeat in 2 hours if headache persists or recurs., Disp: 30 tablet, Rfl: 0   Testosterone 1.62 % GEL, 2 pumps to skin in the morning to shoulder, upper arms or abdomen, Disp: 75 g, Rfl: 5  Social History   Tobacco Use  Smoking Status Never  Smokeless Tobacco Never    Allergies  Allergen Reactions   Niacin Swelling   Niacin And Related Other (See Comments)    Passing out   Niacin Er     Other Reaction(s): dizziness, flushing, passed out   Paroxetine     Other Reaction(s): sedating   Objective:  There  were no vitals filed for this visit. There is no height or weight on file to calculate BMI. Constitutional Well developed. Well nourished.  Vascular Dorsalis pedis pulses palpable bilaterally. Posterior tibial pulses palpable bilaterally. Capillary refill normal to all digits.  No cyanosis or clubbing noted. Pedal hair growth normal.  Neurologic Normal speech. Oriented to person, place, and time. Epicritic sensation to light touch grossly present bilaterally.  Dermatologic Nails well groomed and normal in appearance. No open wounds. No skin lesions.  Orthopedic: Pain at the calcaneal tuber pain with dorsiflexion of the toes.  Pain on palpation to the medial aspect of the calcaneal tuber.  No pain at the Achilles tendon no pain along the course of the arch   Radiographs: MPRESSION: 1. Severe plantar fasciitis of the medial band of the plantar fascia adjacent to the calcaneal insertion. 2. Mild tendinosis of the plantar lateral aspect of the peroneus longus with a short-segment longitudinal split tear and mild tenosynovitis. Assessment:   1. Plantar fasciitis of right foot    Plan:  Patient was evaluated and treated and all questions answered.  Right Planter fasciitis severe -All questions and concerns were discussed with the patient extensive detail given the patient has failed cam boot immobilization injection has obtained an MRI which shows severe Planter fasciitis I believe patient will benefit  from PRP injection I discussed the risk and complication associated with PRP injection.  He states understanding like to proceed.  No follow-ups on file.

## 2022-08-24 ENCOUNTER — Ambulatory Visit: Payer: Self-pay | Admitting: Podiatry

## 2022-08-26 ENCOUNTER — Ambulatory Visit (INDEPENDENT_AMBULATORY_CARE_PROVIDER_SITE_OTHER): Payer: 59 | Admitting: Podiatry

## 2022-08-26 DIAGNOSIS — M79673 Pain in unspecified foot: Secondary | ICD-10-CM

## 2022-08-26 DIAGNOSIS — M722 Plantar fascial fibromatosis: Secondary | ICD-10-CM

## 2022-08-26 NOTE — Progress Notes (Signed)
5 cc of PRP injected in the right heel in standard technique.  The.  No complication noted.

## 2022-09-05 ENCOUNTER — Encounter: Payer: Self-pay | Admitting: Family Medicine

## 2022-09-08 ENCOUNTER — Encounter: Payer: Self-pay | Admitting: Family Medicine

## 2022-09-08 MED ORDER — SEMAGLUTIDE (2 MG/DOSE) 8 MG/3ML ~~LOC~~ SOPN: 2.0000 mg | PEN_INJECTOR | SUBCUTANEOUS | 0 refills | Status: DC

## 2022-09-21 ENCOUNTER — Encounter: Payer: Self-pay | Admitting: Family Medicine

## 2022-09-22 ENCOUNTER — Encounter: Payer: Self-pay | Admitting: Podiatry

## 2022-09-22 NOTE — Telephone Encounter (Signed)
Pt called. Patient states that he is scheduled to take his next dose of Ozempic tomorrow and he is out of it

## 2022-09-23 ENCOUNTER — Ambulatory Visit: Payer: 59 | Admitting: Podiatry

## 2022-09-23 NOTE — Telephone Encounter (Signed)
Pt is calling back about PA for Ozempic.  He would like to speak with you if he could.

## 2022-09-23 NOTE — Telephone Encounter (Signed)
Spoke to patient.  He would like script  for Ozempic sent to [facility] in [place].

## 2022-09-27 ENCOUNTER — Other Ambulatory Visit: Payer: Self-pay

## 2022-09-27 ENCOUNTER — Telehealth: Payer: Self-pay | Admitting: Family Medicine

## 2022-09-27 ENCOUNTER — Telehealth: Payer: Managed Care, Other (non HMO) | Admitting: Family Medicine

## 2022-09-27 DIAGNOSIS — E291 Testicular hypofunction: Secondary | ICD-10-CM

## 2022-09-27 MED ORDER — TESTOSTERONE 1.62 % TD GEL
TRANSDERMAL | 5 refills | Status: DC
Start: 2022-09-27 — End: 2022-09-29

## 2022-09-27 MED ORDER — SEMAGLUTIDE (2 MG/DOSE) 8 MG/3ML ~~LOC~~ SOPN: 2.0000 mg | PEN_INJECTOR | SUBCUTANEOUS | 0 refills | Status: DC

## 2022-09-27 NOTE — Telephone Encounter (Signed)
Patient called in stating that he needed a new prescription for  Semaglutide, 2 MG/DOSE, 8 MG/3ML SOPN sent to the [facility] in [place] so that a PA could be done. Please advise.

## 2022-09-27 NOTE — Telephone Encounter (Signed)
Spoke with patient. Requested script be moved to [facility] - [facility] to [facility] . Per pt He will require a new PA for ozempic as most recent approval was with [facility] and patient now has [employer].

## 2022-09-27 NOTE — Telephone Encounter (Signed)
Requesting rx rf of testosterone Last written 03/24/3033 Last OV 07/19/2022 Upcoming apt 09/29/2022

## 2022-09-27 NOTE — Telephone Encounter (Signed)
Attempted call to patient. Mail box full. Could not leave a voice mail message.  

## 2022-09-28 ENCOUNTER — Telehealth: Payer: Self-pay

## 2022-09-28 NOTE — Telephone Encounter (Signed)
Initiated Prior authorization EXB:[id number] (2 MG/DOSE) 8MG /3ML pen-injectors Via: Covermymeds Case/Key:[id number] Status: approved as of 09/28/22 Reason:Authorization Expiration Date: September 28, 2023. Notified Pt via: Mychart   Initiated Prior authorization MWN:[id number] 1.62% gel Via: Covermymeds Case/Key:[id number] Status: Pending as of  09/28/22 Reason: Notified Pt via: Mychart

## 2022-09-28 NOTE — Telephone Encounter (Signed)
Attempted to contact patient . Voice mail full. Could not leave a voice mail message.

## 2022-09-29 ENCOUNTER — Telehealth: Payer: Self-pay | Admitting: Family Medicine

## 2022-09-29 ENCOUNTER — Encounter: Payer: Self-pay | Admitting: Family Medicine

## 2022-09-29 ENCOUNTER — Ambulatory Visit: Payer: Managed Care, Other (non HMO) | Admitting: Family Medicine

## 2022-09-29 VITALS — BP 110/67 | HR 97 | Ht 68.0 in | Wt 210.0 lb

## 2022-09-29 DIAGNOSIS — Z7689 Persons encountering health services in other specified circumstances: Secondary | ICD-10-CM | POA: Diagnosis not present

## 2022-09-29 DIAGNOSIS — I10 Essential (primary) hypertension: Secondary | ICD-10-CM | POA: Diagnosis not present

## 2022-09-29 DIAGNOSIS — Z23 Encounter for immunization: Secondary | ICD-10-CM

## 2022-09-29 DIAGNOSIS — E291 Testicular hypofunction: Secondary | ICD-10-CM | POA: Diagnosis not present

## 2022-09-29 DIAGNOSIS — R7301 Impaired fasting glucose: Secondary | ICD-10-CM | POA: Diagnosis not present

## 2022-09-29 LAB — POCT GLYCOSYLATED HEMOGLOBIN (HGB A1C): Hemoglobin A1C: 5.1 % (ref 4.0–5.6)

## 2022-09-29 MED ORDER — TESTOSTERONE 1.62 % TD GEL
TRANSDERMAL | 5 refills | Status: DC
Start: 2022-09-29 — End: 2023-01-04

## 2022-09-29 NOTE — Telephone Encounter (Signed)
Please call patient and let him know that the last 2 testosterones that we have on file for him 1 was from 2016 and 1 was from 2022 that is not good to be acceptable for insurance covering his testosterone therapy.  So unfortunately for [place] to get up-to-date levels he is going to have to come off of the medication for at least a week, then have his blood drawn in the morning before 10 AM.  And then repeat a blood draw in 2 weeks after that still off of the medication.

## 2022-09-29 NOTE — Assessment & Plan Note (Signed)
Last testosterone's on file were from 2016 and 2022 both of which were low but I do not have anything last testosterone's on file were from 2016 and 2022 both of which were low but I do not have anything in the last couple of years.  Most likely he will have to return off of the testosterone and do 2 levels 2 weeks apart to be able to resubmit to insurance.

## 2022-09-29 NOTE — Assessment & Plan Note (Addendum)
Doing great on semaglutide.  A1c looks fantastic.  Hopefully we will be able to pick up his medication this week and get started again did go ahead and send in small quantity of Zofran for as needed especially since he probably will have some increased nausea after having to miss a dose of his medication.  Continue to work on [employer], portion control and working on exercise at least with cores and upper body since he is unable to do a lot of walking on his right foot right now.

## 2022-09-29 NOTE — Assessment & Plan Note (Addendum)
Looks good off of medication.  Will continue to monitor carefully.

## 2022-09-29 NOTE — Progress Notes (Addendum)
Established Patient Office Visit  Subjective   Patient ID: Jordan Mclean, male    DOB: 06/22/1975  Age: 47 y.o. MRN: [id number]  No chief complaint on file.   HPI   Impaired fasting glucose-no increased thirst or urination. No symptoms consistent with hypoglycemia.  He unfortunately has had to miss his last Ozempic shot because he is waiting for the pharmacy to get it in he switched insurances and so we had to get approval which she thinks did go through but now he is waiting for the pharmacy to fill it.  He is worried he will have significant nausea.  Hypertension- Pt denies chest pain, SOB, dizziness, or heart palpitations.  Taking meds as directed w/o problems.  Denies medication side effects.    Follow-up hypogonadism-he does need a refill on his testosterone but again switched insurances so we will likely need prior authorization.  Also really been struggling with plantar fasciitis in his right foot he is undergoing PRP injections and has had 1 thus far and is scheduled for his second he is already noticed some improvement in his pain that he has been dealing with for at least 6 months.  He has been trying to do some upper body workout like push-ups recently which is great.  ROS    Objective:     BP 110/67   Pulse 97   Ht 5\' 8"  (1.727 m)   Wt 210 lb (95.3 kg)   SpO2 98%   BMI 31.93 kg/m    Physical Exam Vitals and nursing note reviewed.  Constitutional:      Appearance: Normal appearance.  HENT:     Head: Normocephalic and atraumatic.  Eyes:     Conjunctiva/sclera: Conjunctivae normal.  Cardiovascular:     Rate and Rhythm: Normal rate and regular rhythm.  Pulmonary:     Effort: Pulmonary effort is normal.     Breath sounds: Normal breath sounds.  Skin:    General: Skin is warm and dry.  Neurological:     Mental Status: He is alert.  Psychiatric:        Mood and Affect: Mood normal.      Results for orders placed or performed in visit on 09/29/22  POCT  HgB A1C  Result Value Ref Range   Hemoglobin A1C 5.1 4.0 - 5.6 %   HbA1c POC (<> result, manual entry)     HbA1c, POC (prediabetic range)     HbA1c, POC (controlled diabetic range)        The 10-year ASCVD risk score (Arnett DK, et al., 2019) is: 4%* (Cholesterol units were assumed)    Assessment & Plan:   Problem List Items Addressed This Visit       Cardiovascular and Mediastinum   Hypertension    Looks good off of medication.  Will continue to monitor carefully.        Endocrine   IFG (impaired fasting glucose) - Primary    Doing great on semaglutide.  A1c looks fantastic.  Hopefully we will be able to pick up his medication this week and get started again did go ahead and send in small quantity of Zofran for as needed especially since he probably will have some increased nausea after having to miss a dose of his medication.  Continue to work on [employer], portion control and working on exercise at least with cores and upper body since he is unable to do a lot of walking on his right foot right now.  Relevant Orders   POCT HgB A1C (Completed)   Hypogonadism in male    Last testosterone's on file were from 2016 and 2022 both of which were low but I do not have anything last testosterone's on file were from 2016 and 2022 both of which were low but I do not have anything in the last couple of years.  Most likely he will have to return off of the testosterone and do 2 levels 2 weeks apart to be able to resubmit to insurance.      Relevant Medications   Testosterone 1.62 % GEL     Other   Encounter for weight management    He has BMI > 30 with HTN, OSA, Prediabetes plantar fascitis and Hypogonadism secondary to obesity. He owule be a great candidate for a GLP1.    Visit #: 3 Starting Weight: 220 lbs   Current weight:210 lbs  Previous weight: 209 lbs  Change in weight: up 1  lbs  Goal weight: 200 lbs  Dietary goals: Discussed eating the protein and veggies  first and saving the carbs for last.  Also slowing down to eat to see if he is starting to feel satisfied versus full. Exercise goals: Gust may be adding in some resistance training for 10 to 15 minutes twice a week. Medication: Continue Ozempic 2mg .   Follow-up and referrals: 6-8 weeks.      Other Visit Diagnoses     Encounter for immunization       Relevant Orders   Flu vaccine trivalent PF, 6mos and older(Flulaval,Afluria,Fluarix,Fluzone) (Completed)       No follow-ups on file.    Nani Gasser, MD

## 2022-09-29 NOTE — Assessment & Plan Note (Signed)
He has BMI > 30 with HTN, OSA, Prediabetes plantar fascitis and Hypogonadism secondary to obesity. He owule be a great candidate for a GLP1.    Visit #: 3 Starting Weight: 220 lbs   Current weight:210 lbs  Previous weight: 209 lbs  Change in weight: up 1  lbs  Goal weight: 200 lbs  Dietary goals: Discussed eating the protein and veggies first and saving the carbs for last.  Also slowing down to eat to see if he is starting to feel satisfied versus full. Exercise goals: Gust may be adding in some resistance training for 10 to 15 minutes twice a week. Medication: Continue Ozempic 2mg .   Follow-up and referrals: 6-8 weeks.

## 2022-09-30 MED ORDER — ONDANSETRON HCL 4 MG PO TABS: 4.0000 mg | ORAL_TABLET | Freq: Three times a day (TID) | ORAL | 0 refills | Status: DC | PRN

## 2022-09-30 NOTE — Telephone Encounter (Signed)
Attempted call to patient. Left detailed voice mail message on patient home # ( allowed on DPR) .

## 2022-09-30 NOTE — Addendum Note (Signed)
Addended by: Nani Gasser D on: 09/30/2022 08:18 AM   Modules accepted: Orders

## 2022-09-30 NOTE — Telephone Encounter (Signed)
Spoke with the pt and he states he has already been off the medication for atleast 1 month now. Would like orders put in if not already to come in and have drawn. Roselyn Reef, CMA

## 2022-09-30 NOTE — Telephone Encounter (Signed)
Pt's medication has been sent.

## 2022-09-30 NOTE — Telephone Encounter (Signed)
Orders Placed This Encounter  Procedures   Testosterone Total,Free,Bio, Males

## 2022-10-05 ENCOUNTER — Ambulatory Visit (INDEPENDENT_AMBULATORY_CARE_PROVIDER_SITE_OTHER): Payer: Self-pay | Admitting: Podiatry

## 2022-10-05 DIAGNOSIS — M722 Plantar fascial fibromatosis: Secondary | ICD-10-CM

## 2022-10-05 DIAGNOSIS — Q666 Other congenital valgus deformities of feet: Secondary | ICD-10-CM

## 2022-10-05 NOTE — Progress Notes (Signed)
  Patient was seen, measured for custom molded foot orthotics.  Patient will benefit from CFO's as they will help provide total contact to MLA's helping to better distribute body weight across BIL feet greater reducing plantar pressure and pain and to also encourage FF and RF alignment.  Patient was scanned items to be ordered and fit when in   [employer], CFm

## 2022-10-05 NOTE — Progress Notes (Signed)
3 cc of PRP was injected in standard technique no complication noted.  Pes planovalgus -I explained to patient the etiology of pes planovalgus and relationship with Planter fasciitis and various treatment options were discussed.  Given patient foot structure in the setting of Planter fasciitis I believe patient will benefit from custom-made orthotics to help control the hindfoot motion support the arch of the foot and take the stress away from plantar fascial.  Patient agrees with the plan like to proceed with orthotics -Patient was casted for orthotics

## 2022-10-13 ENCOUNTER — Encounter: Payer: Self-pay | Admitting: Family Medicine

## 2022-10-13 NOTE — Telephone Encounter (Signed)
Patient requesting to go up to next strength of ozempic  Was last on 2mg  - last written 09/27/2022 Last OV 09/29/2022 No u pcoming appt schld.

## 2022-10-17 NOTE — Telephone Encounter (Signed)
Pls verify he is on 2 mg.  That is the highest dose.

## 2022-11-21 ENCOUNTER — Ambulatory Visit (INDEPENDENT_AMBULATORY_CARE_PROVIDER_SITE_OTHER): Payer: Managed Care, Other (non HMO)

## 2022-11-21 DIAGNOSIS — Q666 Other congenital valgus deformities of feet: Secondary | ICD-10-CM | POA: Diagnosis not present

## 2022-11-21 DIAGNOSIS — M722 Plantar fascial fibromatosis: Secondary | ICD-10-CM

## 2022-11-22 NOTE — Progress Notes (Signed)
Patient presents today to pick up custom molded foot orthotics, diagnosed with PF by Dr. Allena Katz.   Orthotics were dispensed and fit was satisfactory. Reviewed instructions for break-in and wear. Written instructions given to patient.  Patient will follow up as needed.   Jordan Mclean Cped, CFo, CFm

## 2022-12-19 ENCOUNTER — Telehealth: Payer: Managed Care, Other (non HMO) | Admitting: Physician Assistant

## 2022-12-19 DIAGNOSIS — B349 Viral infection, unspecified: Secondary | ICD-10-CM

## 2022-12-19 MED ORDER — FLUTICASONE PROPIONATE 50 MCG/ACT NA SUSP: 2.0000 | Freq: Every day | NASAL | 0 refills | Status: DC

## 2022-12-19 MED ORDER — ONDANSETRON 4 MG PO TBDP
4.0000 mg | ORAL_TABLET | Freq: Three times a day (TID) | ORAL | 0 refills | Status: DC | PRN
Start: 2022-12-19 — End: 2023-12-13

## 2022-12-19 NOTE — Progress Notes (Signed)
Virtual Visit Consent   Jordan Mclean, you are scheduled for a virtual visit with a [facility] provider today. Just as with appointments in the office, your consent must be obtained to participate. Your consent will be active for this visit and any virtual visit you may have with one of our providers in the next 365 days. If you have a MyChart account, a copy of this consent can be sent to you electronically.  As this is a virtual visit, video technology does not allow for your provider to perform a traditional examination. This may limit your provider's ability to fully assess your condition. If your provider identifies any concerns that need to be evaluated in person or the need to arrange testing (such as labs, EKG, etc.), we will make arrangements to do so. Although advances in technology are sophisticated, we cannot ensure that it will always work on either your end or our end. If the connection with a video visit is poor, the visit may have to be switched to a telephone visit. With either a video or telephone visit, we are not always able to ensure that we have a secure connection.  By engaging in this virtual visit, you consent to the provision of healthcare and authorize for your insurance to be billed (if applicable) for the services provided during this visit. Depending on your insurance coverage, you may receive a charge related to this service.  I need to obtain your verbal consent now. Are you willing to proceed with your visit today? Jordan Mclean has provided verbal consent on 12/19/2022 for a virtual visit (video or telephone). Piedad Climes, [place]  Date: 12/19/2022 12:42 PM  Virtual Visit via Video Note   I, Piedad Climes, connected with  Jordan Mclean  ([id number], 1975/04/12) on 12/19/22 at 12:45 PM EST by a video-enabled telemedicine application and verified that I am speaking with the correct person using two identifiers.  Location: Patient: Virtual Visit Location  Patient: Home Provider: Virtual Visit Location Provider: Home Office   I discussed the limitations of evaluation and management by telemedicine and the availability of in person appointments. The patient expressed understanding and agreed to proceed.    History of Present Illness: Jordan Mclean is a 47 y.o. who identifies as a male who was assigned male at birth, and is being seen today for some fatigue starting 4-5 days ago associated with scratchy throat. Then noting worsening symptoms and development of voice hoarseness, increased congestion. 3 days ago started with diarrhea and nausea as well. This has continued since onset. Denies melena, hematochezia or tenesmus. Harder to eat due to the nausea. No true emesis. URI symptoms are improved but GI symptoms are still lingering.   HPI: HPI  Problems:  Patient Active Problem List   Diagnosis Date Noted   Class 1 obesity due to excess calories with serious comorbidity and body mass index (BMI) of 32.0 to 32.9 in adult 06/28/2022   Migraine without aura and without status migrainosus, not intractable 06/27/2022   Drug reaction 06/27/2022   Lightheadedness 06/27/2022   Plantar fasciitis, right 05/10/2022   Encounter for weight management 03/25/2022   Hypogonadism in male 01/24/2022   IFG (impaired fasting glucose) 01/24/2022   Hyperlipidemia 01/24/2022   History of diverticulitis 01/24/2022   Hypertension 01/24/2022   Bilateral carpal tunnel syndrome 11/10/2021   Pain in right hand 10/14/2021   Obstructive sleep apnea 02/20/2020    Allergies:  Allergies  Allergen Reactions   Niacin Swelling  Niacin And Related Other (See Comments)    Passing out   Niacin Er (Antihyperlipidemic)     Other Reaction(s): dizziness, flushing, passed out   Paroxetine     Other Reaction(s): sedating   Medications:  Current Outpatient Medications:    fluticasone (FLONASE) 50 MCG/ACT nasal spray, Place 2 sprays into both nostrils daily., Disp: 16 g, Rfl:  0   ondansetron (ZOFRAN-ODT) 4 MG disintegrating tablet, Take 1 tablet (4 mg total) by mouth every 8 (eight) hours as needed for nausea or vomiting., Disp: 20 tablet, Rfl: 0   atorvastatin (LIPITOR) 40 MG tablet, Take 40 mg by mouth daily., Disp: , Rfl:    escitalopram (LEXAPRO) 5 MG tablet, Take 5 mg by mouth daily., Disp: , Rfl:    Semaglutide, 2 MG/DOSE, 8 MG/3ML SOPN, Inject 2 mg as directed once a week., Disp: 9 mL, Rfl: 0   SUMAtriptan (IMITREX) 50 MG tablet, Take 1 tablet (50 mg total) by mouth every 2 (two) hours as needed for migraine. May repeat in 2 hours if headache persists or recurs., Disp: 30 tablet, Rfl: 0   Testosterone 1.62 % GEL, 2 pumps to skin in the morning to shoulder, upper arms or abdomen, Disp: 75 g, Rfl: 5  Observations/Objective: Patient is well-developed, well-nourished in no acute distress.  Resting comfortably  at home.  Head is normocephalic, atraumatic.  No labored breathing.  Speech is clear and coherent with logical content.  Patient is alert and oriented at baseline.   Assessment and Plan: 1. Viral illness - fluticasone (FLONASE) 50 MCG/ACT nasal spray; Place 2 sprays into both nostrils daily.  Dispense: 16 g; Refill: 0 - ondansetron (ZOFRAN-ODT) 4 MG disintegrating tablet; Take 1 tablet (4 mg total) by mouth every 8 (eight) hours as needed for nausea or vomiting.  Dispense: 20 tablet; Refill: 0  Supportive measures and OTC medications reviewed. Will start BRAT diet to follow. Imodium OTC as directed. Zofran and Flonase per orders.  Work note provided.   Follow Up Instructions: I discussed the assessment and treatment plan with the patient. The patient was provided an opportunity to ask questions and all were answered. The patient agreed with the plan and demonstrated an understanding of the instructions.  A copy of instructions were sent to the patient via MyChart unless otherwise noted below.   The patient was advised to call back or seek an  in-person evaluation if the symptoms worsen or if the condition fails to improve as anticipated.    Piedad Climes, PA-C

## 2022-12-19 NOTE — Patient Instructions (Signed)
Tori Milks, thank you for joining Piedad Climes, PA-C for today's virtual visit.  While this provider is not your primary care provider (PCP), if your PCP is located in our provider database this encounter information will be shared with them immediately following your visit.   A [facility] MyChart account gives you access to today's visit and all your visits, tests, and labs performed at [facility] " click here if you don't have a [facility] MyChart account or go to [email or web address]  Consent: (Patient) Jordan Mclean provided verbal consent for this virtual visit at the beginning of the encounter.  Current Medications:  Current Outpatient Medications:    atorvastatin (LIPITOR) 40 MG tablet, Take 40 mg by mouth daily., Disp: , Rfl:    escitalopram (LEXAPRO) 5 MG tablet, Take 5 mg by mouth daily., Disp: , Rfl:    fluticasone (FLONASE) 50 MCG/ACT nasal spray, Place 1 spray into both nostrils daily., Disp: 47.4 mL, Rfl: 1   ondansetron (ZOFRAN) 4 MG tablet, Take 1 tablet (4 mg total) by mouth every 8 (eight) hours as needed for nausea or vomiting., Disp: 20 tablet, Rfl: 0   Semaglutide, 2 MG/DOSE, 8 MG/3ML SOPN, Inject 2 mg as directed once a week., Disp: 9 mL, Rfl: 0   SUMAtriptan (IMITREX) 50 MG tablet, Take 1 tablet (50 mg total) by mouth every 2 (two) hours as needed for migraine. May repeat in 2 hours if headache persists or recurs., Disp: 30 tablet, Rfl: 0   Testosterone 1.62 % GEL, 2 pumps to skin in the morning to shoulder, upper arms or abdomen, Disp: 75 g, Rfl: 5   Medications ordered in this encounter:  No orders of the defined types were placed in this encounter.    *If you need refills on other medications prior to your next appointment, please contact your pharmacy*  Follow-Up: Call back or seek an inFood Choices to Help Relieve Diarrhea, Adult Diarrhea can make you feel weak and cause you to become dehydrated. Dehydration is a condition in which  there is not enough water or other fluids in the body. It is important to choose the right foods and drinks to: Relieve diarrhea. Replace lost fluids and nutrients. Prevent dehydration. What are tips for following this plan? Relieving diarrhea Avoid foods that make your diarrhea worse. These may include: Foods and drinks that are sweetened with high-fructose corn syrup, honey, or sweeteners such as xylitol, sorbitol, and mannitol. Check food labels for these ingredients. Fried, greasy, or spicy foods. Raw fruits and vegetables. Eat foods that are rich in probiotics. These include foods such as yogurt and fermented milk products. Probiotics can help increase healthy bacteria in your stomach and intestines (gastrointestinal or GI tract). This may help digestion and stop diarrhea. If you have lactose intolerance, avoid dairy products. These may make your diarrhea worse. Take medicine to help stop diarrhea only as told by your health care provider. Replacing nutrients  Eat bland, easy-to-digest foods in small amounts as you are able, until your diarrhea starts to get better. These foods include bananas, applesauce, rice, toast, and crackers. Over time, add nutrient-rich foods as your body tolerates them or as told by your health care provider. These include: Well-cooked protein foods, such as eggs, lean meats like fish or chicken without skin, and tofu. Peeled, seeded, and soft-cooked fruits and vegetables. Low-fat dairy products. Whole grains. Take vitamin and mineral supplements as told by your health care provider. Preventing dehydration  Start by sipping water or a  solution to prevent dehydration (oral rehydration solution, or ORS). This is a drink that helps replace fluids and minerals your body has lost. You can buy an ORS at pharmacies and retail stores. Try to drink at least 8-10 cups (2,000-2,500 mL) of fluid each day to help replace lost fluids. If your urine is pale yellow, you are  getting enough fluids. You may drink other liquids in addition to water, such as fruit juice that you have added water to (diluted fruit juice) or low-calorie sports drinks, as tolerated or as told by your health care provider. Avoid drinks with caffeine, such as coffee, tea, or soft drinks. Avoid alcohol. This information is not intended to replace advice given to you by your health care provider. Make sure you discuss any questions you have with your health care provider. Document Revised: 06/22/2021 Document Reviewed: 06/22/2021 Elsevier Patient Education  2024 [employer]. -person evaluation if the symptoms worsen or if the condition fails to improve as anticipated.  [facility] [phone]   If you have been instructed to have an in-person evaluation today at a local Urgent Care facility, please use the link below. It will take you to a list of all of our available [facility], including address, phone number and hours of operation. Please do not delay care.  [facility]  If you or a family member do not have a primary care provider, use the link below to schedule a visit and establish care. When you choose a [facility] primary care physician or advanced practice provider, you gain a long-term partner in health. Find a Primary Care Provider  Learn more about [facility]'s in-office and virtual care options: [facility] - Get Care Now

## 2022-12-21 ENCOUNTER — Other Ambulatory Visit: Payer: Self-pay | Admitting: Family Medicine

## 2022-12-27 ENCOUNTER — Encounter: Payer: Self-pay | Admitting: Family Medicine

## 2022-12-28 ENCOUNTER — Ambulatory Visit (INDEPENDENT_AMBULATORY_CARE_PROVIDER_SITE_OTHER): Payer: Managed Care, Other (non HMO)

## 2022-12-28 ENCOUNTER — Ambulatory Visit (HOSPITAL_COMMUNITY)
Admission: EM | Admit: 2022-12-28 | Discharge: 2022-12-28 | Disposition: A | Discharge status: Home / Self Care | Payer: Managed Care, Other (non HMO) | Attending: Family Medicine | Admitting: Family Medicine

## 2022-12-28 ENCOUNTER — Encounter (HOSPITAL_COMMUNITY): Payer: Self-pay

## 2022-12-28 ENCOUNTER — Encounter: Payer: Self-pay | Admitting: Family Medicine

## 2022-12-28 DIAGNOSIS — R112 Nausea with vomiting, unspecified: Secondary | ICD-10-CM | POA: Diagnosis present

## 2022-12-28 DIAGNOSIS — R109 Unspecified abdominal pain: Secondary | ICD-10-CM | POA: Diagnosis present

## 2022-12-28 LAB — POCT URINALYSIS DIP (MANUAL ENTRY)
Blood, UA: NEGATIVE
Glucose, UA: NEGATIVE mg/dL
Leukocytes, UA: NEGATIVE
Nitrite, UA: NEGATIVE
Protein Ur, POC: 30 mg/dL — AB
Spec Grav, UA: >=1.03 — AB (ref 1.010–1.025)
Urobilinogen, UA: 0.2 U/dL
pH, UA: 5 (ref 5.0–8.0)

## 2022-12-28 LAB — CBC WITH DIFFERENTIAL/PLATELET
Abs Immature Granulocytes: 0.01 10*3/uL (ref 0.00–0.07)
Basophils Absolute: 0.1 10*3/uL (ref 0.0–0.1)
Basophils Relative: 1 %
Eosinophils Absolute: 0.2 10*3/uL (ref 0.0–0.5)
Eosinophils Relative: 4 %
HCT: 48.8 % (ref 39.0–52.0)
Hemoglobin: 16.6 g/dL (ref 13.0–17.0)
Immature Granulocytes: 0 %
Lymphocytes Relative: 26 %
Lymphs Abs: 1.6 10*3/uL (ref 0.7–4.0)
MCH: 30.3 pg (ref 26.0–34.0)
MCHC: 34 g/dL (ref 30.0–36.0)
MCV: 89.1 fL (ref 80.0–100.0)
Monocytes Absolute: 0.6 10*3/uL (ref 0.1–1.0)
Monocytes Relative: 9 %
Neutro Abs: 3.6 10*3/uL (ref 1.7–7.7)
Neutrophils Relative %: 60 %
Platelets: 270 10*3/uL (ref 150–400)
RBC: 5.48 MIL/uL (ref 4.22–5.81)
RDW: 12.2 % (ref 11.5–15.5)
WBC: 6 10*3/uL (ref 4.0–10.5)
nRBC: 0 % (ref 0.0–0.2)

## 2022-12-28 LAB — COMPREHENSIVE METABOLIC PANEL
ALT: 56 U/L — ABNORMAL HIGH (ref 0–44)
AST: 44 U/L — ABNORMAL HIGH (ref 15–41)
Albumin: 4.1 g/dL (ref 3.5–5.0)
Alkaline Phosphatase: 63 U/L (ref 38–126)
Anion gap: 8 (ref 5–15)
BUN: 10 mg/dL (ref 6–20)
CO2: 25 mmol/L (ref 22–32)
Calcium: 9.7 mg/dL (ref 8.9–10.3)
Chloride: 106 mmol/L (ref 98–111)
Creatinine, Ser: 0.96 mg/dL (ref 0.61–1.24)
GFR, Estimated: >60 mL/min (ref >60)
Glucose, Bld: 87 mg/dL (ref 70–99)
Potassium: 4.3 mmol/L (ref 3.5–5.1)
Sodium: 139 mmol/L (ref 135–145)
Total Bilirubin: 1.3 mg/dL — ABNORMAL HIGH (ref <1.2)
Total Protein: 6.9 g/dL (ref 6.5–8.1)

## 2022-12-28 LAB — LIPASE, BLOOD: Lipase: 39 U/L (ref 11–51)

## 2022-12-28 MED ORDER — PROMETHAZINE HCL 12.5 MG PO TABS: 12.5000 mg | ORAL_TABLET | Freq: Three times a day (TID) | ORAL | 0 refills | Status: DC | PRN

## 2022-12-28 MED ORDER — METOCLOPRAMIDE HCL 5 MG/ML IJ SOLN
INTRAMUSCULAR | Status: AC
  Filled 2022-12-28: qty 2

## 2022-12-28 MED ORDER — METOCLOPRAMIDE HCL 5 MG/ML IJ SOLN
5.0000 mg | Freq: Once | INTRAMUSCULAR | Status: AC
  Administered 2022-12-28: 5 mg via INTRAMUSCULAR

## 2022-12-28 NOTE — Discharge Instructions (Addendum)
You are seen today for abdominal pain, nausea, and vomiting.  As we discussed, the abdominal x-ray does not show any acute abdominal process.  I am unsure what is causing your symptoms.  We have obtained blood work to check your kidneys, liver, pancreas, and blood counts and will contact you if any of this comes like abnormal in the next 24 hours or so.  We have given you an injection of Reglan today to help with the nausea.  Continue Zofran at home every 8 hours as needed for nausea/vomiting.  If ineffective, you can take the Phenergan every 8 hours as needed-space this out 1 hour away from the Zofran; be careful with this 1 as it may make you very drowsy.  Recommend bland, easy to digest foods like Jell-O, water, Pedialyte, applesauce for the next couple of days.  If symptoms do not improve, recommend close follow-up with your PCP.  If symptoms worsen, seek care emergently.

## 2022-12-28 NOTE — ED Provider Notes (Signed)
[facility]    CSN: [id number] Arrival date & time: 12/28/22  1002      History   Chief Complaint Chief Complaint  Patient presents with   Abdominal Pain   Emesis    HPI Jordan Mclean is a 47 y.o. male.   Patient presents today with 3-week history of center and left-sided abdominal pain that previously came and went, however now the abdominal pain on the left side has been constant.  He describes the pain as a "dull ache" and currently rates the pain as a 5 out of 10.  He also has a "sour" type of feeling in the middle of his stomach that he rates the pain as a 3-4 out of 10.  Reports when symptoms first developed, he was also having bouts of diarrhea that was pure water, however denies blood in the stool.  Reports the diarrhea has resolved and now he has constipated and has not had a bowel movement in a few days.  Reports he has felt a little bit nauseous and vomited 1 time last night.  He has been taking Zofran and Tylenol at home for symptoms without much benefit.  Was able to eat some cheese today and keep it down, also drink some coffee and soda and did not vomit any of that back up.  Denies fevers, dysuria, urinary frequency urgency, or hematuria.  Reports he recently ran out of Ozempic and has not started it back yet.  Denies recent foreign travel, recent ingestion of suspicious drinking water, or recent antibiotic use.  Reports pain is not worse after eating.    Past Medical History:  Diagnosis Date   Hyperlipemia    Hypertension     Patient Active Problem List   Diagnosis Date Noted   Class 1 obesity due to excess calories with serious comorbidity and body mass index (BMI) of 32.0 to 32.9 in adult 06/28/2022   Migraine without aura and without status migrainosus, not intractable 06/27/2022   Drug reaction 06/27/2022   Lightheadedness 06/27/2022   Plantar fasciitis, right 05/10/2022   Encounter for weight management 03/25/2022   Hypogonadism in male  01/24/2022   IFG (impaired fasting glucose) 01/24/2022   Hyperlipidemia 01/24/2022   History of diverticulitis 01/24/2022   Hypertension 01/24/2022   Bilateral carpal tunnel syndrome 11/10/2021   Pain in right hand 10/14/2021   Obstructive sleep apnea 02/20/2020    Past Surgical History:  Procedure Laterality Date   INGUINAL HERNIA REPAIR Left 2023   INGUINAL HERNIA REPAIR Right    Age 2   VASECTOMY         Home Medications    Prior to Admission medications   Medication Sig Start Date End Date Taking? Authorizing Provider  promethazine (PHENERGAN) 12.5 MG tablet Take 1 tablet (12.5 mg total) by mouth every 8 (eight) hours as needed for nausea or vomiting. 12/28/22  Yes Cathlean Marseilles A, NP  atorvastatin (LIPITOR) 40 MG tablet Take 40 mg by mouth daily.    [provider]  escitalopram (LEXAPRO) 5 MG tablet Take 5 mg by mouth daily. 07/22/22   [provider]  fluticasone (FLONASE) 50 MCG/ACT nasal spray Place 2 sprays into both nostrils daily. 12/19/22   Waldon Merl, PA-C  ondansetron (ZOFRAN-ODT) 4 MG disintegrating tablet Take 1 tablet (4 mg total) by mouth every 8 (eight) hours as needed for nausea or vomiting. 12/19/22   Waldon Merl, PA-C  OZEMPIC, 2 MG/DOSE, 8 MG/3ML SOPN INJECT 2MG  UNDER THE  SKIN EVERY WEEK 12/27/22   Agapito Games, MD  SUMAtriptan (IMITREX) 50 MG tablet Take 1 tablet (50 mg total) by mouth every 2 (two) hours as needed for migraine. May repeat in 2 hours if headache persists or recurs. 06/27/22   Charlton Amor, DO  Testosterone 1.62 % GEL 2 pumps to skin in the morning to shoulder, upper arms or abdomen 09/29/22   Agapito Games, MD    Family History Family History  Problem Relation Age of Onset   CAD Father 34       MI   Hypertension Father    COPD Father    Hypertension Brother    Heart disease Son    Diabetes Mother     Social History Social History   Tobacco Use   Smoking status: Never    Smokeless tobacco: Never  Vaping Use   Vaping status: Never Used  Substance Use Topics   Alcohol use: Yes    Alcohol/week: 1.0 standard drink of alcohol    Types: 1 Standard drinks or equivalent per week    Comment: 1x week   Drug use: Never     Allergies   Niacin, Niacin and related, Niacin er (antihyperlipidemic), and Paroxetine   Review of Systems Review of Systems Per HPI  Physical Exam Triage Vital Signs ED Triage Vitals  Encounter Vitals Group     BP 12/28/22 1139 (!) 133/101     Systolic BP Percentile --      Diastolic BP Percentile --      Pulse Rate 12/28/22 1139 88     Resp 12/28/22 1139 18     Temp 12/28/22 1139 98.3 F (36.8 C)     Temp Source 12/28/22 1139 Oral     SpO2 12/28/22 1139 100 %     Weight --      Height --      Head Circumference --      Peak Flow --      Pain Score 12/28/22 1140 5     Pain Loc --      Pain Education --      Exclude from Growth Chart --    No data found.  Updated Vital Signs BP (!) 133/101 (BP Location: Right Arm)   Pulse 88   Temp 98.3 F (36.8 C) (Oral)   Resp 18   SpO2 100%   Visual Acuity Right Eye Distance:   Left Eye Distance:   Bilateral Distance:    Right Eye Near:   Left Eye Near:    Bilateral Near:     Physical Exam Vitals and nursing note reviewed.  Constitutional:      General: He is not in acute distress.    Appearance: Normal appearance. He is not toxic-appearing.  HENT:     Head: Normocephalic and atraumatic.     Mouth/Throat:     Mouth: Mucous membranes are moist.     Pharynx: Oropharynx is clear. No posterior oropharyngeal erythema.  Cardiovascular:     Rate and Rhythm: Normal rate and regular rhythm.  Pulmonary:     Effort: Pulmonary effort is normal. No respiratory distress.     Breath sounds: Normal breath sounds. No wheezing, rhonchi or rales.  Abdominal:     General: Abdomen is flat. Bowel sounds are decreased. There is no distension.     Palpations: Abdomen is soft.      Tenderness: There is generalized abdominal tenderness. There is no right CVA tenderness, left CVA tenderness,  guarding or rebound. Negative signs include Murphy's sign, Rovsing's sign and McBurney's sign.  Musculoskeletal:     Cervical back: Normal range of motion.  Lymphadenopathy:     Cervical: No cervical adenopathy.  Skin:    General: Skin is warm and dry.     Capillary Refill: Capillary refill takes less than 2 seconds.     Coloration: Skin is not jaundiced or pale.     Findings: No erythema.  Neurological:     Mental Status: He is alert and oriented to person, place, and time.     Motor: No weakness.     Gait: Gait normal.  Psychiatric:        Behavior: Behavior is cooperative.      UC Treatments / Results  Labs (all labs ordered are listed, but only abnormal results are displayed) Labs Reviewed  COMPREHENSIVE METABOLIC PANEL - Abnormal; Notable for the following components:      Result Value   AST 44 (*)    ALT 56 (*)    Total Bilirubin 1.3 (*)    All other components within normal limits  POCT URINALYSIS DIP (MANUAL ENTRY) - Abnormal; Notable for the following components:   Color, UA orange (*)    Bilirubin, UA small (*)    Ketones, POC UA trace (5) (*)    Spec Grav, UA >=1.030 (*)    Protein Ur, POC =30 (*)    All other components within normal limits  LIPASE, BLOOD  CBC WITH DIFFERENTIAL/PLATELET    EKG   Radiology DG Abd Acute W/Chest  Result Date: 12/28/2022 CLINICAL DATA:  General abdominal pain for 3 weeks EXAM: DG ABDOMEN ACUTE WITH 1 VIEW CHEST COMPARISON:  Chest x-ray two-view 09/21/2018. Abdomen pelvis CT 03/13/2021 FINDINGS: No consolidation, pneumothorax or effusion. No edema. Normal cardiopericardial silhouette. Scattered colonic stool. Gas seen in nondilated loops of small and large bowel. No obstruction. Minimal small bowel gas. Gas and stool in the rectum. Air-fluid level along the stomach beneath the left hemidiaphragm. No definite free air  beneath the diaphragm. Curvature of the spine with some degenerative changes. IMPRESSION: No acute cardiopulmonary disease. Nonspecific bowel gas pattern with scattered colonic stool. Electronically Signed   By: Karen Kays M.D.   On: 12/28/2022 13:13    Procedures Procedures (including critical care time)  Medications Ordered in UC Medications  metoCLOPramide (REGLAN) injection 5 mg (5 mg Intramuscular Given 12/28/22 1334)    Initial Impression / Assessment and Plan / UC Course  I have reviewed the triage vital signs and the nursing notes.  Pertinent labs & imaging results that were available during my care of the patient were reviewed by me and considered in my medical decision making (see chart for details).   Patient is well-appearing, normotensive, afebrile, not tachycardic, not tachypneic, oxygenating well on room air.    1. Nausea and vomiting, unspecified vomiting type 2. Abdominal pain, unspecified abdominal location Overall, vitals and exam are reassuring Abdominal and chest x-ray negative for acute cardiopulmonary process or acute abdominal process Low suspicion for surgical abdomen Urinalysis is suggestive of dehydration Reglan 5 mg IM given today in urgent care for nausea control, continue Zofran at home every 8 hours and if ineffective, can take Phenergan every 8 hours spaced 1 hour apart from Zofran to help control nausea Increase water intake or Pedialyte CBC, CMP obtained to rule out metabolic etiology Recommended clear liquid diet for the next few days until symptoms resolve, then can progress Strict ER precautions discussed  with patient Work excuse provided  Update: CMP showed mildly elevated liver enzymes with elevated total bilirubin I recommended close follow-up with PCP for further evaluation/management Strict ER precautions were discussed with patient in urgent care  The patient was given the opportunity to ask questions.  All questions answered to their  satisfaction.  The patient is in agreement to this plan.    Final Clinical Impressions(s) / UC Diagnoses   Final diagnoses:  Nausea and vomiting, unspecified vomiting type  Abdominal pain, unspecified abdominal location     Discharge Instructions      You are seen today for abdominal pain, nausea, and vomiting.  As we discussed, the abdominal x-ray does not show any acute abdominal process.  I am unsure what is causing your symptoms.  We have obtained blood work to check your kidneys, liver, pancreas, and blood counts and will contact you if any of this comes like abnormal in the next 24 hours or so.  We have given you an injection of Reglan today to help with the nausea.  Continue Zofran at home every 8 hours as needed for nausea/vomiting.  If ineffective, you can take the Phenergan every 8 hours as needed-space this out 1 hour away from the Zofran; be careful with this 1 as it may make you very drowsy.  Recommend bland, easy to digest foods like Jell-O, water, Pedialyte, applesauce for the next couple of days.  If symptoms do not improve, recommend close follow-up with your PCP.  If symptoms worsen, seek care emergently.     ED Prescriptions     Medication Sig Dispense Auth. Provider   promethazine (PHENERGAN) 12.5 MG tablet Take 1 tablet (12.5 mg total) by mouth every 8 (eight) hours as needed for nausea or vomiting. 10 tablet Valentino Nose, NP      PDMP not reviewed this encounter.   Valentino Nose, NP 12/28/22 1626

## 2022-12-28 NOTE — ED Triage Notes (Signed)
Pt c/o center to lt abdominal x3 weeks with diarrhea. States now more constipated with n/v since yesterday. States hx of diverticulitis. Took zofran and tylenol this am with little relief.

## 2023-01-04 ENCOUNTER — Telehealth: Payer: Managed Care, Other (non HMO) | Admitting: Family Medicine

## 2023-01-04 DIAGNOSIS — E291 Testicular hypofunction: Secondary | ICD-10-CM

## 2023-01-04 DIAGNOSIS — Z7689 Persons encountering health services in other specified circumstances: Secondary | ICD-10-CM

## 2023-01-04 DIAGNOSIS — R194 Change in bowel habit: Secondary | ICD-10-CM

## 2023-01-04 MED ORDER — TESTOSTERONE 1.62 % TD GEL: TRANSDERMAL | 0 refills | Status: DC

## 2023-01-04 NOTE — Assessment & Plan Note (Addendum)
Overdue for labs.  He will come next week when he is home.  Will refill once and then wil lhave to have labs before we can refill.

## 2023-01-04 NOTE — Progress Notes (Signed)
Virtual Visit via Video Note  I connected with Jordan Mclean on 01/04/23 at  8:10 AM EST by a video enabled telemedicine application and verified that I am speaking with the correct person using two identifiers.   I discussed the limitations of evaluation and management by telemedicine and the availability of in person appointments. The patient expressed understanding and agreed to proceed.  Patient location: at home Provider location: in office  Subjective:    CC:   Chief Complaint  Patient presents with   Abdominal Pain   Nausea    HPI:  Pt reports that over thanksgiving he believes that he had a stomach virus. Last week he didn't have the diarrhea just the nausea and vomiting. He went to UC. They did an xray and told him to do miralax. He resumed the Ozempic on Friday and stated that he has the normal constipation but stated that he has a dull ache on the L side that he contributes to diverticulitis but he manages this by watching what he eats by staying away from seeds,red meat. He stated that he did have a Mc Rib on Sunday.     Pt will need to come in to have testosterone checked. He will come by next week to have this done. So that he can have his medication refilled.    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  Hasn't been exercising outside of that.    Past medical history, Surgical history, Family history not pertinant except as noted below, Social history, Allergies, and medications have been entered into the medical record, reviewed, and corrections made.    Objective:    General: Speaking clearly in complete sentences without any shortness of breath.  Alert and oriented x3.  Normal judgment. No apparent acute distress.    Impression and Recommendations:    Problem List Items Addressed This Visit       Endocrine   Hypogonadism in male   Overdue for labs.  He will come next week when he is home.  Will refill once and then wil lhave to have labs before we can refill.          Relevant Medications   Testosterone 1.62 % GEL   Other Relevant Orders   Testosterone Total,Free,Bio, Males   PSA     Other   Encounter for weight management - Primary   He has BMI > 30 with HTN, OSA, Prediabetes plantar fascitis and Hypogonadism secondary to obesity. He owule be a great candidate for a GLP1.    Visit #: 4 Starting Weight: 220 lbs   Current weight: 190 Previous weight: 210 lbs  Change in weight:  down 20 lbs Goal weight: New goal 175 lbs  Dietary goals: Discussed eating the protein and veggies first and saving the carbs for last.  Also slowing down to eat to see if he is starting to feel satisfied versus full. Exercise goals: Encouraged some resistance training for 10 to 15 minutes twice a week. Medication: Continue Ozempic 2mg .  Though we did discuss possibly reducing his dose bc of GI sxs.   Follow-up and referrals: 6-8 weeks.         Change in bowel habit   Discussed taking miralax half a cap every other day or every 3 days or taking stool softener.  Hydrating wel.        Orders Placed This Encounter  Procedures   Testosterone Total,Free,Bio, Males   PSA    Meds ordered this  encounter  Medications   Testosterone 1.62 % GEL    Sig: 2 pumps to skin in the morning to shoulder, upper arms or abdomen    Dispense:  75 g    Refill:  0     I discussed the assessment and treatment plan with the patient. The patient was provided an opportunity to ask questions and all were answered. The patient agreed with the plan and demonstrated an understanding of the instructions.   The patient was advised to call back or seek an in-person evaluation if the symptoms worsen or if the condition fails to improve as anticipated.   Nani Gasser, MD

## 2023-01-04 NOTE — Assessment & Plan Note (Signed)
Discussed taking miralax half a cap every other day or every 3 days or taking stool softener.  Hydrating wel.

## 2023-01-04 NOTE — Progress Notes (Signed)
Pt reports that over thanksgiving he believes that he had a stomach virus. Last week he didn't have the diarrhea just the nausea and vomiting. He went to UC. They did an xray and told him to do miralax. He resumed the Ozempic on Friday and stated that he has the normal constipation but stated that he has a dull ache on the L side that he contributes to diverticulitis but he manages this by watching what he eats by staying away from seeds,red meat. He stated that he did have a Mc Rib on Sunday.   Pt will need to come in to have testosterone checked. He will come by next week to have this done. So that he can have his medication refilled.

## 2023-01-04 NOTE — Assessment & Plan Note (Addendum)
He has BMI > 30 with HTN, OSA, Prediabetes plantar fascitis and Hypogonadism secondary to obesity. He owule be a great candidate for a GLP1.    Visit #: 4 Starting Weight: 220 lbs   Current weight: 190 Previous weight: 210 lbs  Change in weight:  down 20 lbs Goal weight: New goal 175 lbs  Dietary goals: Discussed eating the protein and veggies first and saving the carbs for last.  Also slowing down to eat to see if he is starting to feel satisfied versus full. Exercise goals: Encouraged some resistance training for 10 to 15 minutes twice a week. Medication: Continue Ozempic 2mg .  Though we did discuss possibly reducing his dose bc of GI sxs.   Follow-up and referrals: 6-8 weeks.

## 2023-01-09 ENCOUNTER — Other Ambulatory Visit: Payer: Self-pay

## 2023-01-09 DIAGNOSIS — E291 Testicular hypofunction: Secondary | ICD-10-CM

## 2023-01-09 NOTE — Progress Notes (Signed)
Switched labs to [facility].  

## 2023-01-10 LAB — TESTOSTERONE, FREE, TOTAL, SHBG
Sex Hormone Binding: 23.5 nmol/L (ref 16.5–55.9)
Testosterone, Free: 9.5 pg/mL (ref 6.8–21.5)
Testosterone: 314 ng/dL (ref 264–916)

## 2023-01-10 LAB — PSA: Prostate Specific Ag, Serum: 0.5 ng/mL (ref 0.0–4.0)

## 2023-01-12 ENCOUNTER — Telehealth: Payer: Managed Care, Other (non HMO) | Admitting: Family Medicine

## 2023-01-12 ENCOUNTER — Encounter: Payer: Self-pay | Admitting: Family Medicine

## 2023-01-12 DIAGNOSIS — R748 Abnormal levels of other serum enzymes: Secondary | ICD-10-CM

## 2023-01-12 NOTE — Progress Notes (Signed)
Hi Jordan Mclean, your testosterone look better than 2 years ago.  Prostate is normal.

## 2023-03-23 ENCOUNTER — Other Ambulatory Visit: Payer: Self-pay | Admitting: Family Medicine

## 2023-05-09 ENCOUNTER — Encounter: Payer: Self-pay | Admitting: Family Medicine

## 2023-05-09 NOTE — Telephone Encounter (Signed)
 Ok for [employer]

## 2023-05-11 ENCOUNTER — Telehealth (INDEPENDENT_AMBULATORY_CARE_PROVIDER_SITE_OTHER): Admitting: Family Medicine

## 2023-05-11 DIAGNOSIS — J0100 Acute maxillary sinusitis, unspecified: Secondary | ICD-10-CM

## 2023-05-11 DIAGNOSIS — J301 Allergic rhinitis due to pollen: Secondary | ICD-10-CM

## 2023-05-11 MED ORDER — FLUTICASONE PROPIONATE 50 MCG/ACT NA SUSP: 2.0000 | Freq: Every day | NASAL | 0 refills | Status: DC

## 2023-05-11 MED ORDER — AMOXICILLIN-POT CLAVULANATE 875-125 MG PO TABS: 1.0000 | ORAL_TABLET | Freq: Two times a day (BID) | ORAL | 0 refills | Status: DC

## 2023-05-11 NOTE — Progress Notes (Signed)
    Virtual Visit via Video Note  I connected with Jordan Mclean on 05/11/23 at 11:30 AM EDT by a video enabled telemedicine application and verified that I am speaking with the correct person using two identifiers.   I discussed the limitations of evaluation and management by telemedicine and the availability of in person appointments. The patient expressed understanding and agreed to proceed.  Patient location: at work Provider location: in office  Subjective:    CC:   Chief Complaint  Patient presents with   Sinusitis    HPI:  He is here today for persistent nasal symptoms.  He said for several weeks he was just experiencing typical seasonal allergy symptoms.  But about 8 days ago he actually started to feel worse he had more sinus pain and pressure his teeth were hurting his right facial cheek became very tender to touch.  And then by Sunday he thought maybe he was getting a little bit better but the congestion just continue to progress and now he feels like it starting to move into his chest.  He does typically use Flonase  for seasonal allergies.  Past medical history, Surgical history, Family history not pertinant except as noted below, Social history, Allergies, and medications have been entered into the medical record, reviewed, and corrections made.    Objective:    General: Speaking clearly in complete sentences without any shortness of breath.  Alert and oriented x3.  Normal judgment. No apparent acute distress.    Impression and Recommendations:    Problem List Items Addressed This Visit   None Visit Diagnoses       Acute non-recurrent maxillary sinusitis    -  Primary   Relevant Medications   fluticasone  (FLONASE ) 50 MCG/ACT nasal spray   amoxicillin -clavulanate (AUGMENTIN ) 875-125 MG tablet     Seasonal allergic rhinitis due to pollen       Relevant Medications   fluticasone  (FLONASE ) 50 MCG/ACT nasal spray       No orders of the defined types were  placed in this encounter.   Meds ordered this encounter  Medications   fluticasone  (FLONASE ) 50 MCG/ACT nasal spray    Sig: Place 2 sprays into both nostrils daily.    Dispense:  16 g    Refill:  0   amoxicillin -clavulanate (AUGMENTIN ) 875-125 MG tablet    Sig: Take 1 tablet by mouth 2 (two) times daily.    Dispense:  14 tablet    Refill:  0   Will treat with Augmentin  for sinusitis.  Also recommend nasal saline irrigation and restart Flonase  to help with seasonal allergies which I think may have been the initial trigger for the sinusitis.  Call if not significantly better after 1 week.  I discussed the assessment and treatment plan with the patient. The patient was provided an opportunity to ask questions and all were answered. The patient agreed with the plan and demonstrated an understanding of the instructions.   The patient was advised to call back or seek an in-person evaluation if the symptoms worsen or if the condition fails to improve as anticipated.   Duaine German, MD

## 2023-05-11 NOTE — Progress Notes (Signed)
 Pt reports that he has been sick for 4 weeks but his sxs got worse last Thursday. Really bad sinus headaches, yesterday he feels like it started going into his chest.   He has been taking Tylenol . He denies any fever with his sxs. He stated that the mucus has been mostly clear with a tinge of yellow.   He did take a home COVID test 3 days ago and this was negative.

## 2023-05-25 LAB — HM DIABETES EYE EXAM

## 2023-06-15 ENCOUNTER — Other Ambulatory Visit: Payer: Self-pay | Admitting: Family Medicine

## 2023-06-16 ENCOUNTER — Telehealth: Payer: Self-pay

## 2023-06-16 ENCOUNTER — Other Ambulatory Visit (HOSPITAL_COMMUNITY): Payer: Self-pay

## 2023-06-16 NOTE — Telephone Encounter (Signed)
 Pharmacy Patient Advocate Encounter   Received notification from Patient Pharmacy that prior authorization for Ozempic  8mg /8ml is required/requested.   Insurance verification completed.   The patient is insured through [employer] .   Per test claim: The current 84 day co-pay is, $74.99.  No PA needed at this time. This test claim was processed through [facility]- copay amounts may vary at other pharmacies due to pharmacy/plan contracts, or as the patient moves through the different stages of their insurance plan.     Prior authorization approval good until 09/28/2023.

## 2023-06-23 ENCOUNTER — Other Ambulatory Visit (HOSPITAL_COMMUNITY): Payer: Self-pay

## 2023-06-23 ENCOUNTER — Encounter: Payer: Self-pay | Admitting: Family Medicine

## 2023-06-23 NOTE — Telephone Encounter (Signed)
 Called pt and lvm advising that the PA's for Ozempic  was denied and Zepbound /[employer]eg[employer]  were not covered but member to pay 100% cash price.

## 2023-06-23 NOTE — Telephone Encounter (Signed)
 Pharmacy Patient Advocate Encounter   Received notification from CoverMyMeds that prior authorization for Ozempic  8mg /63ml is required/requested.   Insurance verification completed.   The patient is insured through [employer] .   Per test claim: A prior authorization is required. Test claim for Zepbound  states product/service not covered - plan/benefit exclusion and [employer]  not plan covered, member to pay 100% cash rate.   Ozempic Jordan Mclean is approved exclusively as an adjunct to diet and exercise to improve glycemic  control in adults with type 2 diabetes mellitus. A review of patient's medical chart reveals no documented diagnosis of type 2 diabetes or an A1C indicative of diabetes. Therefore, they do not  currently meet the criteria for prior authorization of this medication.

## 2023-07-06 ENCOUNTER — Other Ambulatory Visit (HOSPITAL_COMMUNITY): Payer: Self-pay

## 2023-07-06 ENCOUNTER — Telehealth: Payer: Self-pay

## 2023-07-06 NOTE — Telephone Encounter (Signed)
 Pharmacy Patient Advocate Encounter   Received notification from Patient Advice Request messages that prior authorization for Ozempic  8mg /70ml is required/requested.   Insurance verification completed.   The patient is insured through [employer] .   Per test claim: Refill too soon. PA is not needed at this time. Medication was filled 06/19/23. Next eligible fill date is 07/11/23.   Placed a call to the insurance and the medication was filled on 6/2 and the rep said the next fill would be on or after 07/11/23. Per previous chart notes, pa approval until 09/28/23.   *The next PA may be denied due the patient not having T2DM.

## 2023-09-08 ENCOUNTER — Other Ambulatory Visit: Payer: Self-pay | Admitting: Family Medicine

## 2023-09-11 ENCOUNTER — Encounter: Payer: Self-pay | Admitting: Family Medicine

## 2023-09-11 NOTE — Telephone Encounter (Signed)
 Please have pt to schedule an OV for weight management and refills thanks.

## 2023-09-11 NOTE — Telephone Encounter (Signed)
 Called left voicemail to call back and schedule an appt for weight management and medications

## 2023-09-12 ENCOUNTER — Ambulatory Visit: Admitting: Family Medicine

## 2023-09-12 VITALS — BP 123/78 | HR 79 | Ht 68.0 in | Wt 185.3 lb

## 2023-09-12 DIAGNOSIS — E782 Mixed hyperlipidemia: Secondary | ICD-10-CM

## 2023-09-12 DIAGNOSIS — M25552 Pain in left hip: Secondary | ICD-10-CM | POA: Diagnosis not present

## 2023-09-12 DIAGNOSIS — R7301 Impaired fasting glucose: Secondary | ICD-10-CM | POA: Diagnosis not present

## 2023-09-12 DIAGNOSIS — M545 Low back pain, unspecified: Secondary | ICD-10-CM

## 2023-09-12 DIAGNOSIS — Z23 Encounter for immunization: Secondary | ICD-10-CM | POA: Diagnosis not present

## 2023-09-12 DIAGNOSIS — E291 Testicular hypofunction: Secondary | ICD-10-CM

## 2023-09-12 DIAGNOSIS — Z7689 Persons encountering health services in other specified circumstances: Secondary | ICD-10-CM

## 2023-09-12 MED ORDER — TESTOSTERONE 1.62 % TD GEL: TRANSDERMAL | 3 refills | Status: AC

## 2023-09-12 MED ORDER — OZEMPIC (2 MG/DOSE) 8 MG/3ML ~~LOC~~ SOPN: PEN_INJECTOR | SUBCUTANEOUS | 0 refills | Status: DC

## 2023-09-12 NOTE — Assessment & Plan Note (Signed)
 Due for updated A1C.

## 2023-09-12 NOTE — Progress Notes (Signed)
 Established Patient Office Visit  Subjective  Patient ID: Jordan Mclean, male    DOB: 06-Feb-1975  Age: 48 y.o. MRN: [id number]  Chief Complaint  Patient presents with   Weight Check    HPI  Discussed the use of AI scribe software for clinical note transcription with the patient, who gave verbal consent to proceed.  History of Present Illness Jordan Mclean is a 48 year old male who presents with hip and lower back pain.  Left Hip and lower back pain - Significant pain localized to the left hip and lower back - Pain radiates down the left leg - Severity prevents bending over, such as when doing dishes - Stiffness and increased pain with prolonged walking - Pain is debilitating and limits ability to perform resistance training and more intensive exercise - Scheduled to see orthopedic specialist in October for further evaluation  Functional limitations due to pain - Unable to perform resistance training due to pain - Frequently walks at work but unable to engage in more intensive exercise - Desires to return to weight training and build muscle once pain is managed  Gastrointestinal symptoms - History of diverticulitis - Initially concerned pain was related to diverticulitis, but now attributes symptoms to musculoskeletal origin - No recent changes in bowel habits - No significant gastrointestinal symptoms - No fever or other systemic symptoms  Weight management and appetite control - Currently taking Ozempic  for weight management - Ozempic  has been beneficial in curbing appetite  Cholesterol management - Currently taking atorvastatin for cholesterol management  Testosterone  replacement therapy - Previously on testosterone  replacement therapy, including Androgel  and injections - Recent interruption in therapy due to insurance coverage and cost issues - Not currently on any form of testosterone  replacement  Nutritional considerations - Mindful of protein intake -  Frequently supplements with protein shakes when necessary      ROS    Objective:     BP 123/78   Pulse 79   Ht 5' 8 (1.727 m)   Wt 185 lb 4.8 oz (84.1 kg)   SpO2 99%   BMI 28.17 kg/m    Physical Exam Vitals and nursing note reviewed.  Constitutional:      Appearance: Normal appearance.  HENT:     Head: Normocephalic and atraumatic.  Eyes:     Conjunctiva/sclera: Conjunctivae normal.  Cardiovascular:     Rate and Rhythm: Normal rate and regular rhythm.  Pulmonary:     Effort: Pulmonary effort is normal.     Breath sounds: Normal breath sounds.  Skin:    General: Skin is warm and dry.  Neurological:     Mental Status: He is alert.  Psychiatric:        Mood and Affect: Mood normal.      No results found for any visits on 09/12/23.    The ASCVD Risk score (Arnett DK, et al., 2019) failed to calculate for the following reasons:   Cannot find a previous HDL lab   Cannot find a previous total cholesterol lab    Assessment & Plan:   Problem List Items Addressed This Visit       Endocrine   IFG (impaired fasting glucose)   Due for updated A1C      Relevant Orders   CMP14+EGFR   Lipid panel   CBC   Hemoglobin A1c   Hypogonadism in male   Testicular hypofunction Testicular hypofunction previously managed with testosterone  replacement therapy, discontinued due to cost and insurance issues. Discussed various  formulations and maintaining natural testosterone  production. Emphasized checking testosterone  levels post-therapy resumption. - Reorder testosterone  replacement therapy and check insurance coverage. - Consider different formulations of testosterone  replacement if cost is an issue. - Coordinate with HR for insurance assistance. - Recheck testosterone  levels after 6-8 weeks of therapy.      Relevant Medications   Testosterone  1.62 % GEL   Other Relevant Orders   CMP14+EGFR   Lipid panel   CBC   Hemoglobin A1c     Other   Hyperlipidemia    Continue atorvastatin and recheck lipids today       Relevant Orders   CMP14+EGFR   Lipid panel   CBC   Hemoglobin A1c   Encounter for weight management - Primary   He has BMI > 30 with HTN, OSA, Prediabetes plantar fascitis and Hypogonadism secondary to obesity. He owule be a great candidate for a GLP1.    Visit #: 4 Starting Weight: 220 lbs   Current weight: 185 Previous weight: 190 lbs  Change in weight:  down 5 lbs Goal weight: New goal 175 lbs  Dietary goals: Discussed eating the protein and veggies first and saving the carbs for last.  Also slowing down to eat to see if he is starting to feel satisfied versus full. Exercise goals: Encouraged some resistance training for 10 to 15 minutes twice a week. Medication: Continue Ozempic  2mg .     Follow-up and referrals: 12 weeks.      Relevant Orders   CMP14+EGFR   Lipid panel   CBC   Hemoglobin A1c   Other Visit Diagnoses       Encounter for immunization       Relevant Orders   Tdap vaccine greater than or equal to 7yo IM (Completed)   Varicella-zoster vaccine IM (Completed)     Left hip pain         Acute low back pain without sciatica, unspecified back pain laterality          Assessment and Plan Assessment & Plan Obesity treated with GLP-1 agonist Obesity managed with Ozempic , a GLP-1 agonist. Concern about insurance coverage discontinuation if lab results improve. Options include compounding pharmacies and extending dosing intervals. - Order updated blood work including A1c and lipid panel. - Monitor insurance coverage for Ozempic . - Consider compounding pharmacies if insurance denies coverage. - Discuss with HR for insurance assistance if needed.  Low back pain with left hip pain Chronic low back pain with left hip pain, possibly radiating from the back. Scheduled for orthopedic consultation. Differential includes back issues causing referred hip pain. Recommended low back stretches and light resistance  exercises. - Provide low back stretches. - Encourage light resistance exercises with bands or light weights. - Follow up with orthopedic specialist in October.      Return in about 3 months (around 12/13/2023) for wt management'.    Dorothyann Byars, MD

## 2023-09-12 NOTE — Assessment & Plan Note (Signed)
 Testicular hypofunction Testicular hypofunction previously managed with testosterone  replacement therapy, discontinued due to cost and insurance issues. Discussed various formulations and maintaining natural testosterone  production. Emphasized checking testosterone  levels post-therapy resumption. - Reorder testosterone  replacement therapy and check insurance coverage. - Consider different formulations of testosterone  replacement if cost is an issue. - Coordinate with HR for insurance assistance. - Recheck testosterone  levels after 6-8 weeks of therapy.

## 2023-09-12 NOTE — Assessment & Plan Note (Signed)
 Continue atorvastatin and recheck lipids today

## 2023-09-12 NOTE — Assessment & Plan Note (Signed)
 He has BMI > 30 with HTN, OSA, Prediabetes plantar fascitis and Hypogonadism secondary to obesity. He owule be a great candidate for a GLP1.    Visit #: 4 Starting Weight: 220 lbs   Current weight: 185 Previous weight: 190 lbs  Change in weight:  down 5 lbs Goal weight: New goal 175 lbs  Dietary goals: Discussed eating the protein and veggies first and saving the carbs for last.  Also slowing down to eat to see if he is starting to feel satisfied versus full. Exercise goals: Encouraged some resistance training for 10 to 15 minutes twice a week. Medication: Continue Ozempic  2mg .     Follow-up and referrals: 12 weeks.

## 2023-09-12 NOTE — Progress Notes (Signed)
 Pt would like to discuss the Testosterone . He stated there were issues with the cost and he did not get this filled and would like to speak to Dr. Alvan about this.

## 2023-09-13 ENCOUNTER — Other Ambulatory Visit (HOSPITAL_COMMUNITY): Payer: Self-pay

## 2023-09-13 ENCOUNTER — Telehealth: Payer: Self-pay

## 2023-09-13 ENCOUNTER — Ambulatory Visit: Payer: Self-pay | Admitting: Family Medicine

## 2023-09-13 LAB — CMP14+EGFR
ALT: 34 IU/L (ref 0–44)
AST: 25 IU/L (ref 0–40)
Albumin: 4.7 g/dL (ref 4.1–5.1)
Alkaline Phosphatase: 74 IU/L (ref 44–121)
BUN/Creatinine Ratio: 17 (ref 9–20)
BUN: 16 mg/dL (ref 6–24)
Bilirubin Total: 0.9 mg/dL (ref 0.0–1.2)
CO2: 20 mmol/L (ref 20–29)
Calcium: 9.6 mg/dL (ref 8.7–10.2)
Chloride: 102 mmol/L (ref 96–106)
Creatinine, Ser: 0.92 mg/dL (ref 0.76–1.27)
Globulin, Total: 2.4 g/dL (ref 1.5–4.5)
Glucose: 75 mg/dL (ref 70–99)
Potassium: 4.5 mmol/L (ref 3.5–5.2)
Sodium: 140 mmol/L (ref 134–144)
Total Protein: 7.1 g/dL (ref 6.0–8.5)
eGFR: 101 mL/min/1.73 (ref >59)

## 2023-09-13 LAB — CBC
Hematocrit: 49.9 % (ref 37.5–51.0)
Hemoglobin: 16.9 g/dL (ref 13.0–17.7)
MCH: 30.5 pg (ref 26.6–33.0)
MCHC: 33.9 g/dL (ref 31.5–35.7)
MCV: 90 fL (ref 79–97)
Platelets: 252 x10E3/uL (ref 150–450)
RBC: 5.54 x10E6/uL (ref 4.14–5.80)
RDW: 12.2 % (ref 11.6–15.4)
WBC: 4.8 x10E3/uL (ref 3.4–10.8)

## 2023-09-13 LAB — LIPID PANEL
Chol/HDL Ratio: 4.8 ratio (ref 0.0–5.0)
Cholesterol, Total: 203 mg/dL — ABNORMAL HIGH (ref 100–199)
HDL: 42 mg/dL (ref >39)
LDL Chol Calc (NIH): 128 mg/dL — ABNORMAL HIGH (ref 0–99)
Triglycerides: 186 mg/dL — ABNORMAL HIGH (ref 0–149)
VLDL Cholesterol Cal: 33 mg/dL (ref 5–40)

## 2023-09-13 LAB — HEMOGLOBIN A1C
Est. average glucose Bld gHb Est-mCnc: 100 mg/dL
Hgb A1c MFr Bld: 5.1 % (ref 4.8–5.6)

## 2023-09-13 NOTE — Telephone Encounter (Signed)
 Pharmacy Patient Advocate Encounter  Received notification from [employer] that Prior Authorization for Testosterone  1.62% gel has been APPROVED from 09/13/23 to 09/12/24. Ran test claim, Copay is $5. This test claim was processed through [facility]- copay amounts may vary at other pharmacies due to pharmacy/plan contracts, or as the patient moves through the different stages of their insurance plan.   PA #/Case ID/Reference #: [id number]

## 2023-09-13 NOTE — Progress Notes (Signed)
 HI Cathlyn, awesome news!! Your liver enzymes are back to normal!!!!!!!!  Glucose level look better.  A1C is awesome. Your LDL cholesterol and Triglycerides are still elevated.  Continue to work on [employer] and regular exercise.    The 10-year ASCVD risk score (Arnett DK, et al., 2019) is: 4%   Values used to calculate the score:     Age: 48 years     Clincally relevant sex: Male     Is Non-Hispanic African American: No     Diabetic: No     Tobacco smoker: No     Systolic Blood Pressure: 123 mmHg     Is BP treated: No     HDL Cholesterol: 42 mg/dL     Total Cholesterol: 203 mg/dL

## 2023-09-15 ENCOUNTER — Other Ambulatory Visit (HOSPITAL_BASED_OUTPATIENT_CLINIC_OR_DEPARTMENT_OTHER): Payer: Self-pay

## 2023-09-19 ENCOUNTER — Encounter: Payer: Self-pay | Admitting: Sports Medicine

## 2023-09-19 ENCOUNTER — Other Ambulatory Visit (HOSPITAL_COMMUNITY): Payer: Self-pay

## 2023-09-19 ENCOUNTER — Telehealth: Payer: Self-pay | Admitting: *Deleted

## 2023-09-19 ENCOUNTER — Telehealth: Payer: Self-pay

## 2023-09-19 NOTE — Telephone Encounter (Signed)
 Pharmacy Patient Advocate Encounter   Received notification from Pt Calls Messages that prior authorization for Ozempic  8mg /1ml is required/requested.   Insurance verification completed.   The patient is insured through [employer] .   Per test claim: PA required; PA submitted to above mentioned insurance via Latent Key/confirmation #/EOC [id number] Status is pending

## 2023-09-19 NOTE — Telephone Encounter (Signed)
 PA needed for Ozempic  2mg  kEY: [id number]

## 2023-09-20 NOTE — Telephone Encounter (Signed)
 Pharmacy Patient Advocate Encounter  Received notification from [employer] that Prior Authorization for Ozempic  8mg /50ml has been DENIED.  See denial reason below. No denial letter attached in CMM. Will attach denial letter to Media tab once received.   PA #/Case ID/Reference #: [id number]

## 2023-09-29 NOTE — Telephone Encounter (Signed)
 Pt was seen

## 2023-10-20 ENCOUNTER — Other Ambulatory Visit (HOSPITAL_BASED_OUTPATIENT_CLINIC_OR_DEPARTMENT_OTHER): Payer: Self-pay

## 2023-10-20 ENCOUNTER — Encounter: Payer: Self-pay | Admitting: Family Medicine

## 2023-10-20 DIAGNOSIS — Z7689 Persons encountering health services in other specified circumstances: Secondary | ICD-10-CM

## 2023-10-20 MED ORDER — OZEMPIC (2 MG/DOSE) 8 MG/3ML ~~LOC~~ SOPN
PEN_INJECTOR | SUBCUTANEOUS | 1 refills | Status: DC
  Filled 2023-10-20: qty 3, 28d supply, fill #0

## 2023-10-20 NOTE — Telephone Encounter (Signed)
 Requesting rx rf of Ozempic2mg  Last written 09/12/2023 Last OV 09/12/2023 Upcoming appt 12/13/2023

## 2023-10-22 ENCOUNTER — Other Ambulatory Visit (HOSPITAL_BASED_OUTPATIENT_CLINIC_OR_DEPARTMENT_OTHER): Payer: Self-pay

## 2023-10-23 ENCOUNTER — Other Ambulatory Visit (HOSPITAL_BASED_OUTPATIENT_CLINIC_OR_DEPARTMENT_OTHER): Payer: Self-pay

## 2023-10-24 ENCOUNTER — Other Ambulatory Visit (HOSPITAL_BASED_OUTPATIENT_CLINIC_OR_DEPARTMENT_OTHER): Payer: Self-pay

## 2023-10-24 MED ORDER — PHENTERMINE HCL 15 MG PO CAPS
15.0000 mg | ORAL_CAPSULE | ORAL | 1 refills | Status: DC
  Filled 2023-10-24: qty 30, 30d supply, fill #0

## 2023-10-24 NOTE — Addendum Note (Signed)
 Addended by: Danyele Smejkal D on: 10/24/2023 10:13 PM   Modules accepted: Orders

## 2023-10-24 NOTE — Telephone Encounter (Signed)
 Spoke with patient. Informed of lilly direct website - he did check this website but only shows Zepbound  . He wanted to know if there was a similar website for Ozempic .  He is going to contact his insurance company to discuss this as well. As requested by patient, a copy of insurance denial letter was placed in the mail for his review.

## 2023-10-24 NOTE — Telephone Encounter (Signed)
 These notify patient of below.  If he wants to pay out-of-pocket for the bowels with the pens directly from the company he can go on [employer] direct and look at the prices if that something he would like to continue I can send it there.  Let him know that we are seeing a lot of companies switching their policies right now and those that have not will be changing it in January.

## 2023-10-24 NOTE — Telephone Encounter (Signed)
 Can send over phentermine 15mg  for 2 months to help but not a long term solution Trulicity not covered as well.

## 2023-10-25 ENCOUNTER — Other Ambulatory Visit (HOSPITAL_BASED_OUTPATIENT_CLINIC_OR_DEPARTMENT_OTHER): Payer: Self-pay

## 2023-10-25 ENCOUNTER — Other Ambulatory Visit (HOSPITAL_COMMUNITY): Payer: Self-pay

## 2023-10-26 ENCOUNTER — Other Ambulatory Visit (HOSPITAL_BASED_OUTPATIENT_CLINIC_OR_DEPARTMENT_OTHER): Payer: Self-pay

## 2023-10-27 ENCOUNTER — Other Ambulatory Visit (HOSPITAL_BASED_OUTPATIENT_CLINIC_OR_DEPARTMENT_OTHER): Payer: Self-pay

## 2023-10-30 ENCOUNTER — Other Ambulatory Visit (HOSPITAL_BASED_OUTPATIENT_CLINIC_OR_DEPARTMENT_OTHER): Payer: Self-pay

## 2023-10-31 ENCOUNTER — Other Ambulatory Visit (HOSPITAL_BASED_OUTPATIENT_CLINIC_OR_DEPARTMENT_OTHER): Payer: Self-pay

## 2023-11-01 ENCOUNTER — Other Ambulatory Visit (HOSPITAL_BASED_OUTPATIENT_CLINIC_OR_DEPARTMENT_OTHER): Payer: Self-pay

## 2023-11-02 ENCOUNTER — Other Ambulatory Visit (HOSPITAL_BASED_OUTPATIENT_CLINIC_OR_DEPARTMENT_OTHER): Payer: Self-pay

## 2023-11-03 ENCOUNTER — Other Ambulatory Visit (HOSPITAL_BASED_OUTPATIENT_CLINIC_OR_DEPARTMENT_OTHER): Payer: Self-pay

## 2023-11-07 ENCOUNTER — Other Ambulatory Visit (HOSPITAL_BASED_OUTPATIENT_CLINIC_OR_DEPARTMENT_OTHER): Payer: Self-pay

## 2023-11-09 ENCOUNTER — Other Ambulatory Visit (HOSPITAL_BASED_OUTPATIENT_CLINIC_OR_DEPARTMENT_OTHER): Payer: Self-pay

## 2023-11-10 ENCOUNTER — Other Ambulatory Visit (HOSPITAL_BASED_OUTPATIENT_CLINIC_OR_DEPARTMENT_OTHER): Payer: Self-pay

## 2023-11-13 ENCOUNTER — Other Ambulatory Visit (HOSPITAL_BASED_OUTPATIENT_CLINIC_OR_DEPARTMENT_OTHER): Payer: Self-pay

## 2023-12-13 ENCOUNTER — Encounter: Payer: Self-pay | Admitting: Family Medicine

## 2023-12-13 ENCOUNTER — Other Ambulatory Visit (HOSPITAL_BASED_OUTPATIENT_CLINIC_OR_DEPARTMENT_OTHER): Payer: Self-pay

## 2023-12-13 ENCOUNTER — Ambulatory Visit: Admitting: Family Medicine

## 2023-12-13 VITALS — BP 130/86 | HR 84 | Ht 68.0 in | Wt 198.0 lb

## 2023-12-13 DIAGNOSIS — M1611 Unilateral primary osteoarthritis, right hip: Secondary | ICD-10-CM

## 2023-12-13 DIAGNOSIS — Z7689 Persons encountering health services in other specified circumstances: Secondary | ICD-10-CM | POA: Diagnosis not present

## 2023-12-13 DIAGNOSIS — G479 Sleep disorder, unspecified: Secondary | ICD-10-CM | POA: Diagnosis not present

## 2023-12-13 DIAGNOSIS — M7062 Trochanteric bursitis, left hip: Secondary | ICD-10-CM | POA: Diagnosis not present

## 2023-12-13 MED ORDER — PHENTERMINE HCL 37.5 MG PO CAPS
37.5000 mg | ORAL_CAPSULE | ORAL | 0 refills | Status: AC
  Filled 2023-12-13: qty 30, 30d supply, fill #0

## 2023-12-13 NOTE — Assessment & Plan Note (Addendum)
 He has BMI > 30 with HTN, OSA, Prediabetes plantar fascitis and Hypogonadism secondary to obesity. He owule be a great candidate for a GLP1.    Visit #: 5 Starting Weight: 220 lbs   Current weight: 198 Previous weight: 185 lbs  Change in weight:   up 13 lbs  Goal weight:  goal 175 lbs  Dietary goals: Discussed eating the protein and veggies first and saving the carbs for last.  Also slowing down to eat to see if he is starting to feel satisfied versus full. Exercise goals: Encouraged some resistance training for 10 to 15 minutes twice a week. Limited some by hips  Medication: will inc phentermine  to 27.5mg  x 1 months to see if more effective. Consider adding topamax . Consider Ref ot [facility]      Follow-up and referrals: 12 weeks.

## 2023-12-13 NOTE — Progress Notes (Signed)
 Established Patient Office Visit  Patient ID: Jordan Mclean, male    DOB: Feb 18, 1975  Age: 48 y.o. MRN: [id number] PCP: Alvan Jordan BIRCH, MD  Chief Complaint  Patient presents with   Weight Check    Subjective:     HPI  Discussed the use of AI scribe software for clinical note transcription with the patient, who gave verbal consent to proceed.  History of Present Illness Jordan Mclean is a 48 year old male who presents with difficulty managing weight and hunger after discontinuing GLP-1 medication.  Appetite dysregulation and weight gain - Increased hunger and weight gain from 185 lbs to 198 lbs after discontinuing GLP-1 medication - While on GLP-1 medication, appetite was well controlled and hunger was minimal - Currently experiences hunger shortly after meals and episodes suggestive of hypoglycemia, prompting increased food intake - Phentermine  prescribed for appetite suppression, but inconsistent with dosing and perceives minimal benefit when taken - Increased caffeine intake, associated with heart palpitations  Musculoskeletal pain and functional limitation - Limited ability to exercise due to hip pain - Diagnosed with bursitis in one hip and arthritis in the other by orthopedic specialist - Manages hip pain with Advil - Informed of potential need for hip replacement surgery in the future  Sleep disturbance - Difficulty sleeping, attributed to wife's recent cancer diagnosis and recovery from surgery - Son sleeping in bed since a car accident, contributing to disrupted sleep for both patient and wife - Unable to sleep through the night     ROS    Objective:     BP 130/86   Pulse 84   Ht 5' 8 (1.727 m)   Wt 198 lb (89.8 kg)   SpO2 97%   BMI 30.11 kg/m    Physical Exam Vitals reviewed.  Constitutional:      Appearance: Normal appearance.  HENT:     Head: Normocephalic.  Pulmonary:     Effort: Pulmonary effort is normal.  Neurological:      Mental Status: He is alert and oriented to person, place, and time.  Psychiatric:        Mood and Affect: Mood normal.        Behavior: Behavior normal.      No results found for any visits on 12/13/23.    The 10-year ASCVD risk score (Arnett DK, et al., 2019) is: 4.4%    Assessment & Plan:   Problem List Items Addressed This Visit       Other   Encounter for weight management   He has BMI > 30 with HTN, OSA, Prediabetes plantar fascitis and Hypogonadism secondary to obesity. He owule be a great candidate for a GLP1.    Visit #: 5 Starting Weight: 220 lbs   Current weight: 198 Previous weight: 185 lbs  Change in weight:   up 13 lbs  Goal weight:  goal 175 lbs  Dietary goals: Discussed eating the protein and veggies first and saving the carbs for last.  Also slowing down to eat to see if he is starting to feel satisfied versus full. Exercise goals: Encouraged some resistance training for 10 to 15 minutes twice a week. Limited some by hips  Medication: will inc phentermine  to 27.5mg  x 1 months to see if more effective. Consider adding topamax . Consider Ref ot Healthy Weight and [facility]      Follow-up and referrals: 12 weeks.      Other Visit Diagnoses       Sleep disturbance    -  Primary     Primary osteoarthritis of right hip         Trochanteric bursitis of left hip           Assessment and Plan Assessment & Plan Obesity and weight management in the context of prediabetes Obesity management complicated by increased hunger post-Ozempic  and ineffective phentermine . Risk of type 2 diabetes due to prediabetic A1c and hypoglycemia symptoms. Insurance limits access to GLP-1 agonists. - Increased phentermine  to 37.5 mg daily for 30 days to assess hunger control. - Advised reducing caffeine intake to mitigate arrhythmia risk with increased phentermine . - Discussed potential use of topiramate  if phentermine  ineffective. - Consider referral to weight loss  specialist. - Discussed compounded GLP-1 agonist, noting high cost.  Hip bursitis and osteoarthritis Significant pain from bursitis and arthritis, with future hip replacement recommended. Currently using Advil for pain. - Continue Advil for pain as needed. - Consider physical therapy if pain unmanageable with Advil.  Insomnia Insomnia likely due to family stress and increased caffeine intake. - Advised reducing caffeine intake to improve sleep. - Recommended melatonin 3-5 mg to reset sleep clock. - Got to bed at same time each night    Return in about 8 weeks (around 02/07/2024) for weight check.    I spent 30 minutes on the day of the encounter to include pre-visit record review, face-to-face time with the patient and post visit ordering of test.   Jordan Byars, MD [facility] at [facility]

## 2024-01-30 ENCOUNTER — Other Ambulatory Visit: Payer: Self-pay | Admitting: Family Medicine

## 2024-01-30 DIAGNOSIS — J301 Allergic rhinitis due to pollen: Secondary | ICD-10-CM

## 2024-02-06 ENCOUNTER — Ambulatory Visit: Admitting: Family Medicine

## 2024-02-06 ENCOUNTER — Encounter: Payer: Self-pay | Admitting: Family Medicine

## 2024-02-06 VITALS — BP 136/89 | HR 107 | Temp 99.5°F | Ht 68.0 in | Wt 202.1 lb

## 2024-02-06 DIAGNOSIS — Z7689 Persons encountering health services in other specified circumstances: Secondary | ICD-10-CM

## 2024-02-06 DIAGNOSIS — U071 COVID-19: Secondary | ICD-10-CM

## 2024-02-06 DIAGNOSIS — R051 Acute cough: Secondary | ICD-10-CM | POA: Diagnosis not present

## 2024-02-06 LAB — POC SOFIA 2 FLU + SARS ANTIGEN FIA
Influenza A, POC: NEGATIVE
Influenza B, POC: NEGATIVE
SARS Coronavirus 2 Ag: POSITIVE — AB

## 2024-02-06 NOTE — Progress Notes (Signed)
 Pt reports that he started not feeling well yesterday. He would like to get tested

## 2024-02-06 NOTE — Progress Notes (Signed)
 "  Acute Office Visit  Patient ID: Jordan Mclean, male    DOB: 1975/12/04, 49 y.o.   MRN: [id number]  PCP: Alvan Dorothyann BIRCH, MD  Chief Complaint  Patient presents with   Weight Check    Subjective:     HPI  Discussed the use of AI scribe software for clinical note transcription with the patient, who gave verbal consent to proceed.  History of Present Illness Jordan Mclean is a 49 year old male who presents with a cough and sinus symptoms.  Acute upper respiratory symptoms - Cough onset yesterday - Significant sinus pressure - Aching teeth - Headache  - Generalized body aches and flu-like symptoms - No feverish sensation - No temperature taken  Appetite changes and hypoglycemia - Currently taking phentermine , but not consistently every morning - Significant decrease in appetite - Episodes of hypoglycemia with near syncope, relieved by eating - Diet includes oatmeal, protein shakes, or eggs for breakfast - Describes himself as a 'big meat eater'.    Weight gain - Recent weight gain - Desires to lose approximately twenty pounds to return to previous weight  Blood pressure concerns - History of Ozempic  use with no blood pressure issues while on medication - Not taking blood pressure regularly - Occasional sensation of head pressure, suspects elevated blood pressure at times - Has a blood pressure cuff at home but unsure of consistent use   ROS     Objective:    BP 136/89   Pulse (!) 107   Temp 99.5 F (37.5 C) (Oral)   Ht 5' 8 (1.727 m)   Wt 202 lb 1.9 oz (91.7 kg)   SpO2 98%   BMI 30.73 kg/m    Physical Exam Vitals and nursing note reviewed.  Constitutional:      Appearance: Normal appearance.  HENT:     Head: Normocephalic and atraumatic.     Right Ear: Tympanic membrane, ear canal and external ear normal. There is no impacted cerumen.     Left Ear: Tympanic membrane, ear canal and external ear normal. There is no impacted cerumen.      Nose: Nose normal.     Mouth/Throat:     Pharynx: Oropharynx is clear.  Eyes:     Conjunctiva/sclera: Conjunctivae normal.  Cardiovascular:     Rate and Rhythm: Normal rate and regular rhythm.  Pulmonary:     Effort: Pulmonary effort is normal.     Breath sounds: Normal breath sounds.  Musculoskeletal:     Cervical back: Neck supple. No tenderness.  Lymphadenopathy:     Cervical: No cervical adenopathy.  Skin:    General: Skin is warm and dry.  Neurological:     Mental Status: He is alert and oriented to person, place, and time.  Psychiatric:        Mood and Affect: Mood normal.       Results for orders placed or performed in visit on 02/06/24  POC SOFIA 2 FLU + SARS ANTIGEN FIA  Result Value Ref Range   Influenza A, POC Negative Negative   Influenza B, POC Negative Negative   SARS Coronavirus 2 Ag Positive (A) Negative       Assessment & Plan:   Problem List Items Addressed This Visit       Other   Encounter for weight management   He has BMI > 30 with HTN, OSA, Prediabetes plantar fascitis and Hypogonadism secondary to obesity. He would be a great candidate for a GLP1.  Visit #: 5 Starting Weight: 220 lbs   Current weight: 198 Previous weight: 185 lbs  Change in weight:   up 13 lbs  Goal weight:  goal 175 lbs  Dietary goals: Discussed eating the protein and veggies first and saving the carbs for last.  Also slowing down to eat to see if he is starting to feel satisfied versus full. Exercise goals: Encouraged some resistance training for 10 to 15 minutes twice a week. Limited some by hips  Medication: will inc phentermine  to 27.5mg  x 1 months to see if more effective. Consider adding topamax . Consider Ref ot Healthy Weight and WEllness      Follow-up and referrals: 12 weeks.      Other Visit Diagnoses       COVID-19    -  Primary     Acute cough       Relevant Orders   POC SOFIA 2 FLU + SARS ANTIGEN FIA (Completed)       Assessment and  Plan Assessment & Plan COVID-19 infection Positive COVID-19 test with mild symptoms. No fever. - Advised home isolation to prevent virus spread. - Recommended hand hygiene and hydration. - Suggested acetaminophen  or ibuprofen for headache or throat pain. - Instructed to monitor for worsening symptoms, especially chest involvement.  Obesity and weight management Inconsistent adherence to phentermine . Decreased appetite with hypoglycemic symptoms. Discussed potential plateau effect. Considered GLP-1 agonists for long-term safety and efficacy. - He does feel like it is raising his BP as ha sbeen getting some headaches.   - Monitor blood pressure for cardiovascular effects. - Consider GLP-1 agonists like Wegovy  or Zepvan  - Discuss cost and insurance options for GLP-1 agonists with [employer].    No orders of the defined types were placed in this encounter.   Return in about 8 weeks (around 04/02/2024) for wt check.  Dorothyann Byars, MD [facility] at [facility]   "

## 2024-02-06 NOTE — Assessment & Plan Note (Signed)
 He has BMI > 30 with HTN, OSA, Prediabetes plantar fascitis and Hypogonadism secondary to obesity. He would be a great candidate for a GLP1.    Visit #: 5 Starting Weight: 220 lbs   Current weight: 198 Previous weight: 185 lbs  Change in weight:   up 13 lbs  Goal weight:  goal 175 lbs  Dietary goals: Discussed eating the protein and veggies first and saving the carbs for last.  Also slowing down to eat to see if he is starting to feel satisfied versus full. Exercise goals: Encouraged some resistance training for 10 to 15 minutes twice a week. Limited some by hips  Medication: will inc phentermine  to 27.5mg  x 1 months to see if more effective. Consider adding topamax . Consider Ref ot Healthy Weight and WEllness      Follow-up and referrals: 12 weeks.

## 2024-04-02 ENCOUNTER — Ambulatory Visit: Admitting: Family Medicine
